# Patient Record
Sex: Female | Born: 1965 | Race: Black or African American | Hispanic: No | Marital: Single | Smoking: Never smoker
Health system: Southern US, Community
[De-identification: ages and names within clinical notes are randomized; demographics above are authoritative.]

## PROBLEM LIST (undated history)

## (undated) DIAGNOSIS — E669 Obesity, unspecified: Secondary | ICD-10-CM

## (undated) DIAGNOSIS — IMO0002 Reserved for concepts with insufficient information to code with codable children: Secondary | ICD-10-CM

## (undated) DIAGNOSIS — R87619 Unspecified abnormal cytological findings in specimens from cervix uteri: Secondary | ICD-10-CM

## (undated) HISTORY — DX: Obesity, unspecified: E66.9

## (undated) HISTORY — DX: Reserved for concepts with insufficient information to code with codable children: IMO0002

## (undated) HISTORY — PX: ABDOMINAL HYSTERECTOMY: SHX81

## (undated) HISTORY — PX: TUBAL LIGATION: SHX77

## (undated) HISTORY — DX: Unspecified abnormal cytological findings in specimens from cervix uteri: R87.619

---

## 2011-05-30 ENCOUNTER — Other Ambulatory Visit: Payer: Self-pay | Admitting: Obstetrics & Gynecology

## 2011-05-30 ENCOUNTER — Encounter (INDEPENDENT_AMBULATORY_CARE_PROVIDER_SITE_OTHER): Payer: BC Managed Care – PPO | Admitting: Obstetrics & Gynecology

## 2011-05-30 DIAGNOSIS — Z01419 Encounter for gynecological examination (general) (routine) without abnormal findings: Secondary | ICD-10-CM

## 2011-05-30 DIAGNOSIS — Z1272 Encounter for screening for malignant neoplasm of vagina: Secondary | ICD-10-CM

## 2011-05-30 DIAGNOSIS — Z113 Encounter for screening for infections with a predominantly sexual mode of transmission: Secondary | ICD-10-CM

## 2011-05-30 DIAGNOSIS — N852 Hypertrophy of uterus: Secondary | ICD-10-CM

## 2011-05-30 DIAGNOSIS — R1032 Left lower quadrant pain: Secondary | ICD-10-CM

## 2011-05-31 NOTE — Assessment & Plan Note (Signed)
NAMEMAAME, DACK               ACCOUNT NO.:  [id number]  MEDICAL RECORD NO.:  [id number]           PATIENT TYPE:  LOCATION:  [facility] [place]           FACILITY:  PHYSICIAN:  Allie Bossier, MD             DATE OF BIRTH:  DATE OF SERVICE:  05/30/2011                                 CLINIC NOTE  Ms. Harm is a 45 year old single African American G2, P1, A1 who is here for her annual exam.  She complains of a 54-month history of left lower quadrant pain that comes and goes, it has not been particularly painful, but it is different than her usual.  Her periods are monthly and not excessive concern to her.  PAST MEDICAL HISTORY:  She is overweight/obese.  REVIEW OF SYSTEMS:  She moved from [place] about 7 months ago to be closer to her daughter who is arising sophomore in [place]. She works at [employer] in [employer].  She is abstinent currently and the remainder review of systems questions are negative.  Her Pap smear and mammogram were both done in 2010 and were reportedly normal.  PREVIOUS SURGERIES:  She had a tubal ligation in 2005.  No latex allergies.  No drug allergies.  FAMILY HISTORY:  Negative for breast, GYN, and colon malignancy, but positive for diabetes.  SOCIAL HISTORY:  She reports social alcohol, but denies illegal drug use or tobacco.  PHYSICAL EXAMINATION:  GENERAL:  Well-nourished, well-hydrated pleasant African American female. VITAL SIGNS:  Height 5 feet 4 inches, weight 186 pounds, blood pressure 130/85, pulse 68. HEENT:  Normal heart regular rate rhythm. LUNGS:  Clear to auscultation bilaterally. ABDOMEN:  Benign. BREASTS:  Normal bilaterally. EXTERNAL GENITALIA:  No lesions.  Cervix nulliparous.  Uterus is 10-12 weeks' size and is deviated to her left.  Adnexa are nonenlarged, nontender.  ASSESSMENT AND PLAN: 1. Annual exam.  I have the checked Pap smear, scheduled a mammogram.     I recommend self-breast and self-vulvar exams. 2.  Left lower quadrant pain and an enlarged uterus deviating to the     left.  I am ordering a GYN ultrasound for further evaluation of     this issue.  I am checking cervical cultures. 3. Routine health maintenance.  I am checking fasting lipids, sugar,     and TSH.  I will see her back when her ultrasound results are     available.     Allie Bossier, MD    MCD/MEDQ  D:  05/30/2011  T:  05/30/2011  Job:  [id number]

## 2011-06-01 ENCOUNTER — Ambulatory Visit
Admission: RE | Admit: 2011-06-01 | Discharge: 2011-06-01 | Disposition: A | Discharge status: Home / Self Care | Payer: BC Managed Care – PPO | Source: Ambulatory Visit | Attending: Obstetrics & Gynecology | Admitting: Obstetrics & Gynecology

## 2011-06-01 ENCOUNTER — Other Ambulatory Visit: Payer: BC Managed Care – PPO

## 2011-06-01 DIAGNOSIS — N852 Hypertrophy of uterus: Secondary | ICD-10-CM

## 2011-06-13 ENCOUNTER — Ambulatory Visit (INDEPENDENT_AMBULATORY_CARE_PROVIDER_SITE_OTHER): Payer: BC Managed Care – PPO | Admitting: Obstetrics & Gynecology

## 2011-06-13 VITALS — BP 123/74 | HR 72 | Resp 16 | Ht 65.0 in | Wt 190.0 lb

## 2011-06-13 DIAGNOSIS — D251 Intramural leiomyoma of uterus: Secondary | ICD-10-CM

## 2011-06-13 DIAGNOSIS — E669 Obesity, unspecified: Secondary | ICD-10-CM

## 2011-06-13 NOTE — Progress Notes (Signed)
  45 yo AA g2p1a1here for follow up for evaluation of her pelvic pain/LLQ pain.  Ultlrasound shows 2 large fibroids (4cm and 6cm) as well as a possible polyp.  She is slightly anemic with a HBG of 11.3.  I have offered her an outpatient robotic hysterectomy to be scheduled at her convenience.

## 2012-01-01 ENCOUNTER — Encounter: Payer: Self-pay | Admitting: Obstetrics & Gynecology

## 2012-01-01 ENCOUNTER — Ambulatory Visit (INDEPENDENT_AMBULATORY_CARE_PROVIDER_SITE_OTHER): Payer: BC Managed Care – PPO | Admitting: Obstetrics & Gynecology

## 2012-01-01 VITALS — BP 134/84 | HR 79 | Temp 96.8°F | Resp 16 | Ht 65.0 in | Wt 196.0 lb

## 2012-01-01 DIAGNOSIS — N938 Other specified abnormal uterine and vaginal bleeding: Secondary | ICD-10-CM

## 2012-01-01 DIAGNOSIS — D649 Anemia, unspecified: Secondary | ICD-10-CM

## 2012-01-01 DIAGNOSIS — N949 Unspecified condition associated with female genital organs and menstrual cycle: Secondary | ICD-10-CM

## 2012-01-01 DIAGNOSIS — D259 Leiomyoma of uterus, unspecified: Secondary | ICD-10-CM

## 2012-01-01 DIAGNOSIS — D219 Benign neoplasm of connective and other soft tissue, unspecified: Secondary | ICD-10-CM

## 2012-01-01 NOTE — Progress Notes (Signed)
  Subjective:    Patient ID: Mary Blankenship, female    DOB: July 01, 1966, 46 y.o.   MRN: [id number]  HPI  Ms. Lucus has been doing some considerable thought and research into possible surgery since her last visit here. After a long discussion, she does want to schedule a RATH. We have discussed risks of surgery.  Review of Systems     Objective:   Physical Exam        Assessment & Plan:  Symptomatic fibroids- Schedule RATH.

## 2012-01-11 ENCOUNTER — Encounter: Payer: Self-pay | Admitting: Family

## 2012-01-11 ENCOUNTER — Ambulatory Visit (INDEPENDENT_AMBULATORY_CARE_PROVIDER_SITE_OTHER): Payer: BC Managed Care – PPO | Admitting: Family

## 2012-01-11 VITALS — BP 125/80 | HR 69 | Temp 97.9°F | Resp 16 | Ht 65.0 in | Wt 196.0 lb

## 2012-01-11 DIAGNOSIS — N23 Unspecified renal colic: Secondary | ICD-10-CM

## 2012-01-11 DIAGNOSIS — R300 Dysuria: Secondary | ICD-10-CM

## 2012-01-11 DIAGNOSIS — R309 Painful micturition, unspecified: Secondary | ICD-10-CM

## 2012-01-11 LAB — POCT URINALYSIS DIPSTICK
Ketones, UA: NEGATIVE
Spec Grav, UA: 1.025
pH, UA: 5

## 2012-01-11 MED ORDER — SULFAMETHOXAZOLE-TRIMETHOPRIM 800-160 MG PO TABS: 1.0000 | ORAL_TABLET | Freq: Two times a day (BID) | ORAL | Status: AC

## 2012-01-11 NOTE — Patient Instructions (Signed)
Place urinary tract infection patient instructions here.

## 2012-01-11 NOTE — Progress Notes (Signed)
  Subjective:    Mary Blankenship is a 46 y.o. female who complains of burning with urination. She has had symptoms for 2 days. Patient also complains of none. Patient denies back pain, fever and vaginal discharge. Patient does not have a history of recurrent UTI. Patient does not have a history of pyelonephritis.   The following portions of the patient's history were reviewed and updated as appropriate: allergies, current medications, past family history, past medical history, past social history, past surgical history and problem list.  Review of Systems Pertinent items are noted in HPI.    Objective:    No exam performed today, symptoms did not indicate.  Laboratory:  Urine dipstick: 1+ for hemoglobin.   Micro exam: negative for WBCs or RBCs.    Assessment:    Dysuria  Plan:    Medications: TMP/SMX.  Urine culture sent to lab.

## 2012-01-31 ENCOUNTER — Encounter (HOSPITAL_COMMUNITY): Payer: Self-pay | Admitting: Pharmacist

## 2012-02-12 ENCOUNTER — Encounter (HOSPITAL_COMMUNITY)
Admission: RE | Admit: 2012-02-12 | Discharge: 2012-02-12 | Disposition: A | Discharge status: Home / Self Care | Payer: BC Managed Care – PPO | Source: Ambulatory Visit | Attending: Obstetrics & Gynecology | Admitting: Obstetrics & Gynecology

## 2012-02-12 ENCOUNTER — Encounter (HOSPITAL_COMMUNITY): Payer: Self-pay

## 2012-02-12 ENCOUNTER — Inpatient Hospital Stay (HOSPITAL_COMMUNITY): Admission: RE | Admit: 2012-02-12 | Payer: BC Managed Care – PPO | Source: Ambulatory Visit

## 2012-02-12 LAB — CBC
HCT: 34.9 % — ABNORMAL LOW (ref 36.0–46.0)
Hemoglobin: 11.4 g/dL — ABNORMAL LOW (ref 12.0–15.0)
MCH: 28.4 pg (ref 26.0–34.0)
MCHC: 32.7 g/dL (ref 30.0–36.0)

## 2012-02-12 LAB — SURGICAL PCR SCREEN: Staphylococcus aureus: NEGATIVE

## 2012-02-12 NOTE — Patient Instructions (Addendum)
20 Mary Blankenship  02/12/2012   Your procedure is scheduled on:  02/19/12  Enter through the Main Entrance of [facility] at 6 AM.  Pick up the phone at the desk and dial [phone].   Call this number if you have problems the morning of surgery: [phone]   Remember:   Do not eat food:After Midnight.  Do not drink clear liquids: After Midnight.  Take these medicines the morning of surgery with A SIP OF WATER: NA   Do not wear jewelry, make-up or nail polish.  Do not wear lotions, powders, or perfumes. You may wear deodorant.  Do not shave 48 hours prior to surgery.  Do not bring valuables to the hospital.  Contacts, dentures or bridgework may not be worn into surgery.  Leave suitcase in the car. After surgery it may be brought to your room.  For patients admitted to the hospital, checkout time is 11:00 AM the day of discharge.   Patients discharged the day of surgery will not be allowed to drive home.  Name and phone number of your driver: NA  Special Instructions: CHG Shower Use Special Wash: 1/2 bottle night before surgery and 1/2 bottle morning of surgery.   Please read over the following fact sheets that you were given: MRSA Information

## 2012-02-18 MED ORDER — CEFAZOLIN SODIUM 1-5 GM-% IV SOLN: 1.0000 g | INTRAVENOUS | Status: DC

## 2012-02-18 MED ORDER — CEFAZOLIN SODIUM-DEXTROSE 2-3 GM-% IV SOLR
2.0000 g | INTRAVENOUS | Status: AC
  Administered 2012-02-19: 2 g via INTRAVENOUS
  Filled 2012-02-18: qty 50

## 2012-02-19 ENCOUNTER — Encounter (HOSPITAL_COMMUNITY): Payer: Self-pay | Admitting: Anesthesiology

## 2012-02-19 ENCOUNTER — Ambulatory Visit (HOSPITAL_COMMUNITY)
Admission: RE | Admit: 2012-02-19 | Discharge: 2012-02-20 | Disposition: A | Discharge status: Home / Self Care | Payer: BC Managed Care – PPO | Source: Ambulatory Visit | Attending: Obstetrics & Gynecology | Admitting: Obstetrics & Gynecology

## 2012-02-19 ENCOUNTER — Encounter (HOSPITAL_COMMUNITY): Payer: Self-pay | Admitting: *Deleted

## 2012-02-19 ENCOUNTER — Ambulatory Visit (HOSPITAL_COMMUNITY): Payer: BC Managed Care – PPO | Admitting: Anesthesiology

## 2012-02-19 ENCOUNTER — Encounter (HOSPITAL_COMMUNITY)
Admission: RE | Disposition: A | Discharge status: Home / Self Care | Payer: Self-pay | Source: Ambulatory Visit | Attending: Obstetrics & Gynecology

## 2012-02-19 DIAGNOSIS — N920 Excessive and frequent menstruation with regular cycle: Secondary | ICD-10-CM | POA: Insufficient documentation

## 2012-02-19 DIAGNOSIS — D251 Intramural leiomyoma of uterus: Secondary | ICD-10-CM | POA: Insufficient documentation

## 2012-02-19 DIAGNOSIS — Z01812 Encounter for preprocedural laboratory examination: Secondary | ICD-10-CM

## 2012-02-19 DIAGNOSIS — Z01818 Encounter for other preprocedural examination: Secondary | ICD-10-CM | POA: Insufficient documentation

## 2012-02-19 DIAGNOSIS — D649 Anemia, unspecified: Secondary | ICD-10-CM | POA: Insufficient documentation

## 2012-02-19 HISTORY — PX: CYSTOSCOPY: SHX5120

## 2012-02-19 SURGERY — ROBOTIC ASSISTED TOTAL HYSTERECTOMY
Anesthesia: General | Site: Urethra | Wound class: Clean Contaminated

## 2012-02-19 MED ORDER — DEXAMETHASONE SODIUM PHOSPHATE 4 MG/ML IJ SOLN
INTRAMUSCULAR | Status: DC | PRN
  Administered 2012-02-19: 10 mg via INTRAVENOUS

## 2012-02-19 MED ORDER — IBUPROFEN 800 MG PO TABS
800.0000 mg | ORAL_TABLET | Freq: Three times a day (TID) | ORAL | Status: DC | PRN
  Administered 2012-02-20: 800 mg via ORAL
  Filled 2012-02-19: qty 1

## 2012-02-19 MED ORDER — OXYCODONE-ACETAMINOPHEN 5-325 MG PO TABS
1.0000 | ORAL_TABLET | ORAL | Status: DC | PRN
  Administered 2012-02-19: 1 via ORAL
  Filled 2012-02-19: qty 1

## 2012-02-19 MED ORDER — NEOSTIGMINE METHYLSULFATE 1 MG/ML IJ SOLN
INTRAMUSCULAR | Status: AC
  Filled 2012-02-19: qty 10

## 2012-02-19 MED ORDER — KETOROLAC TROMETHAMINE 30 MG/ML IJ SOLN
15.0000 mg | Freq: Once | INTRAMUSCULAR | Status: AC | PRN
  Administered 2012-02-19: 30 mg via INTRAVENOUS

## 2012-02-19 MED ORDER — DEXAMETHASONE SODIUM PHOSPHATE 10 MG/ML IJ SOLN
INTRAMUSCULAR | Status: AC
  Filled 2012-02-19: qty 1

## 2012-02-19 MED ORDER — IBUPROFEN 800 MG PO TABS: 800.0000 mg | ORAL_TABLET | Freq: Three times a day (TID) | ORAL | Status: AC | PRN

## 2012-02-19 MED ORDER — PROMETHAZINE HCL 25 MG/ML IJ SOLN
12.5000 mg | INTRAMUSCULAR | Status: DC | PRN
  Administered 2012-02-19: 12.5 mg via INTRAVENOUS
  Filled 2012-02-19: qty 1

## 2012-02-19 MED ORDER — LACTATED RINGERS IR SOLN
Status: DC | PRN
  Administered 2012-02-19: 3000 mL

## 2012-02-19 MED ORDER — DEXAMETHASONE SODIUM PHOSPHATE 4 MG/ML IJ SOLN: 8.0000 mg | Freq: Once | INTRAMUSCULAR | Status: DC | PRN

## 2012-02-19 MED ORDER — ONDANSETRON HCL 4 MG/2ML IJ SOLN
INTRAMUSCULAR | Status: AC
  Filled 2012-02-19: qty 2

## 2012-02-19 MED ORDER — GLYCOPYRROLATE 0.2 MG/ML IJ SOLN
INTRAMUSCULAR | Status: AC
  Filled 2012-02-19: qty 1

## 2012-02-19 MED ORDER — HYDROMORPHONE HCL PF 1 MG/ML IJ SOLN
INTRAMUSCULAR | Status: AC
  Administered 2012-02-19: 0.5 mg via INTRAVENOUS
  Filled 2012-02-19: qty 1

## 2012-02-19 MED ORDER — ACETAMINOPHEN 10 MG/ML IV SOLN
1000.0000 mg | Freq: Four times a day (QID) | INTRAVENOUS | Status: DC
  Administered 2012-02-19: 1000 mg via INTRAVENOUS
  Filled 2012-02-19 (×4): qty 100

## 2012-02-19 MED ORDER — MIDAZOLAM HCL 5 MG/5ML IJ SOLN
INTRAMUSCULAR | Status: DC | PRN
  Administered 2012-02-19: 2 mg via INTRAVENOUS

## 2012-02-19 MED ORDER — PROPOFOL 10 MG/ML IV EMUL
INTRAVENOUS | Status: AC
  Filled 2012-02-19: qty 20

## 2012-02-19 MED ORDER — OXYCODONE-ACETAMINOPHEN 5-325 MG PO TABS: 1.0000 | ORAL_TABLET | ORAL | Status: AC | PRN

## 2012-02-19 MED ORDER — INDIGOTINDISULFONATE SODIUM 8 MG/ML IJ SOLN
INTRAMUSCULAR | Status: AC
  Filled 2012-02-19: qty 5

## 2012-02-19 MED ORDER — FENTANYL CITRATE 0.05 MG/ML IJ SOLN
INTRAMUSCULAR | Status: DC | PRN
  Administered 2012-02-19: 100 ug via INTRAVENOUS
  Administered 2012-02-19: 50 ug via INTRAVENOUS
  Administered 2012-02-19: 100 ug via INTRAVENOUS

## 2012-02-19 MED ORDER — FENTANYL CITRATE 0.05 MG/ML IJ SOLN: 25.0000 ug | INTRAMUSCULAR | Status: DC | PRN

## 2012-02-19 MED ORDER — LIDOCAINE HCL (CARDIAC) 20 MG/ML IV SOLN
INTRAVENOUS | Status: AC
  Filled 2012-02-19: qty 5

## 2012-02-19 MED ORDER — FENTANYL CITRATE 0.05 MG/ML IJ SOLN
INTRAMUSCULAR | Status: AC
  Filled 2012-02-19: qty 5

## 2012-02-19 MED ORDER — KETOROLAC TROMETHAMINE 30 MG/ML IJ SOLN: 30.0000 mg | Freq: Once | INTRAMUSCULAR | Status: DC

## 2012-02-19 MED ORDER — NEOSTIGMINE METHYLSULFATE 1 MG/ML IJ SOLN
INTRAMUSCULAR | Status: DC | PRN
  Administered 2012-02-19: 3 mg via INTRAVENOUS

## 2012-02-19 MED ORDER — ROPIVACAINE HCL 5 MG/ML IJ SOLN
INTRAMUSCULAR | Status: DC | PRN
  Administered 2012-02-19: 60 mL via EPIDURAL

## 2012-02-19 MED ORDER — ROPIVACAINE HCL 5 MG/ML IJ SOLN
INTRAMUSCULAR | Status: AC
  Filled 2012-02-19: qty 60

## 2012-02-19 MED ORDER — DEXTROSE IN LACTATED RINGERS 5 % IV SOLN
INTRAVENOUS | Status: DC
  Administered 2012-02-19 – 2012-02-20 (×2): via INTRAVENOUS

## 2012-02-19 MED ORDER — ROCURONIUM BROMIDE 50 MG/5ML IV SOLN
INTRAVENOUS | Status: AC
  Filled 2012-02-19: qty 2

## 2012-02-19 MED ORDER — ROCURONIUM BROMIDE 100 MG/10ML IV SOLN
INTRAVENOUS | Status: DC | PRN
  Administered 2012-02-19: 20 mg via INTRAVENOUS
  Administered 2012-02-19: 50 mg via INTRAVENOUS

## 2012-02-19 MED ORDER — HYDROMORPHONE HCL PF 1 MG/ML IJ SOLN
INTRAMUSCULAR | Status: AC
  Filled 2012-02-19: qty 1

## 2012-02-19 MED ORDER — HYDROMORPHONE HCL PF 1 MG/ML IJ SOLN
0.2500 mg | INTRAMUSCULAR | Status: DC | PRN
  Administered 2012-02-19 (×4): 0.5 mg via INTRAVENOUS

## 2012-02-19 MED ORDER — DEXTROSE IN LACTATED RINGERS 5 % IV SOLN: INTRAVENOUS | Status: DC

## 2012-02-19 MED ORDER — LIDOCAINE HCL (CARDIAC) 20 MG/ML IV SOLN
INTRAVENOUS | Status: DC | PRN
  Administered 2012-02-19: 100 mg via INTRAVENOUS

## 2012-02-19 MED ORDER — INDIGOTINDISULFONATE SODIUM 8 MG/ML IJ SOLN
INTRAMUSCULAR | Status: DC | PRN
  Administered 2012-02-19: 40 mg via INTRAVENOUS

## 2012-02-19 MED ORDER — KETOROLAC TROMETHAMINE 30 MG/ML IJ SOLN
INTRAMUSCULAR | Status: AC
  Administered 2012-02-19: 30 mg via INTRAVENOUS
  Filled 2012-02-19: qty 1

## 2012-02-19 MED ORDER — MIDAZOLAM HCL 2 MG/2ML IJ SOLN
INTRAMUSCULAR | Status: AC
  Filled 2012-02-19: qty 2

## 2012-02-19 MED ORDER — LACTATED RINGERS IV SOLN
INTRAVENOUS | Status: DC | PRN
  Administered 2012-02-19 (×2): via INTRAVENOUS

## 2012-02-19 MED ORDER — HYDROMORPHONE HCL PF 1 MG/ML IJ SOLN
INTRAMUSCULAR | Status: DC | PRN
  Administered 2012-02-19: 1 mg via INTRAVENOUS

## 2012-02-19 MED ORDER — GLYCOPYRROLATE 0.2 MG/ML IJ SOLN
INTRAMUSCULAR | Status: DC | PRN
  Administered 2012-02-19: 0.4 mg via INTRAVENOUS

## 2012-02-19 MED ORDER — MEPERIDINE HCL 25 MG/ML IJ SOLN: 6.2500 mg | INTRAMUSCULAR | Status: DC | PRN

## 2012-02-19 MED ORDER — ONDANSETRON HCL 4 MG/2ML IJ SOLN
INTRAMUSCULAR | Status: DC | PRN
  Administered 2012-02-19: 4 mg via INTRAVENOUS

## 2012-02-19 MED ORDER — PROPOFOL 10 MG/ML IV EMUL
INTRAVENOUS | Status: DC | PRN
  Administered 2012-02-19: 200 mg via INTRAVENOUS

## 2012-02-19 MED ORDER — SODIUM CHLORIDE 0.9 % IJ SOLN
INTRAMUSCULAR | Status: DC | PRN
  Administered 2012-02-19: 60 mL via INTRAVENOUS

## 2012-02-19 SURGICAL SUPPLY — 66 items
BAG URINE DRAINAGE (UROLOGICAL SUPPLIES) ×3 IMPLANT
BARRIER ADHS 3X4 INTERCEED (GAUZE/BANDAGES/DRESSINGS) IMPLANT
BENZOIN TINCTURE PRP APPL 2/3 (GAUZE/BANDAGES/DRESSINGS) IMPLANT
BLADE LAPAROSCOPIC MORCELL KIT (BLADE) IMPLANT
CABLE HIGH FREQUENCY MONO STRZ (ELECTRODE) ×3 IMPLANT
CATH FOLEY 3WAY  5CC 16FR (CATHETERS) ×1
CATH FOLEY 3WAY 5CC 16FR (CATHETERS) ×2 IMPLANT
CLOTH BEACON ORANGE TIMEOUT ST (SAFETY) ×3 IMPLANT
CONT PATH 16OZ SNAP LID 3702 (MISCELLANEOUS) ×3 IMPLANT
COVER MAYO STAND STRL (DRAPES) ×3 IMPLANT
COVER TABLE BACK 60X90 (DRAPES) ×6 IMPLANT
COVER TIP SHEARS 8 DVNC (MISCELLANEOUS) ×2 IMPLANT
COVER TIP SHEARS 8MM DA VINCI (MISCELLANEOUS) ×1
DECANTER SPIKE VIAL GLASS SM (MISCELLANEOUS) ×3 IMPLANT
DERMABOND ADVANCED (GAUZE/BANDAGES/DRESSINGS) ×1
DERMABOND ADVANCED .7 DNX12 (GAUZE/BANDAGES/DRESSINGS) ×2 IMPLANT
DRAPE HUG U DISPOSABLE (DRAPE) ×3 IMPLANT
DRAPE LG THREE QUARTER DISP (DRAPES) ×6 IMPLANT
DRAPE MONITOR DA VINCI (DRAPE) IMPLANT
DRAPE WARM FLUID 44X44 (DRAPE) ×3 IMPLANT
ELECT REM PT RETURN 9FT ADLT (ELECTROSURGICAL) ×3
ELECTRODE REM PT RTRN 9FT ADLT (ELECTROSURGICAL) ×2 IMPLANT
EVACUATOR SMOKE 8.L (FILTER) ×3 IMPLANT
GAUZE VASELINE 3X9 (GAUZE/BANDAGES/DRESSINGS) ×3 IMPLANT
GLOVE BIO SURGEON STRL SZ 6.5 (GLOVE) ×9 IMPLANT
GLOVE ECLIPSE 6.5 STRL STRAW (GLOVE) ×9 IMPLANT
GOWN STRL REIN XL XLG (GOWN DISPOSABLE) ×18 IMPLANT
KIT ACCESSORY DA VINCI DISP (KITS) ×1
KIT ACCESSORY DVNC DISP (KITS) ×2 IMPLANT
KIT DISP ACCESSORY 4 ARM (KITS) IMPLANT
MANIPULATOR UTERINE 4.5 ZUMI (MISCELLANEOUS) IMPLANT
NEEDLE INSUFFLATION 14GA 120MM (NEEDLE) ×3 IMPLANT
NEEDLE SPNL 18GX3.5 QUINCKE PK (NEEDLE) IMPLANT
OCCLUDER COLPOPNEUMO (BALLOONS) ×3 IMPLANT
PACK LAVH (CUSTOM PROCEDURE TRAY) ×3 IMPLANT
PAD PREP 24X48 CUFFED NSTRL (MISCELLANEOUS) ×6 IMPLANT
PLUG CATH AND CAP STER (CATHETERS) ×3 IMPLANT
POSITIONER SURGICAL ARM (MISCELLANEOUS) IMPLANT
PROTECTOR NERVE ULNAR (MISCELLANEOUS) ×6 IMPLANT
SET CYSTO W/LG BORE CLAMP LF (SET/KITS/TRAYS/PACK) IMPLANT
SET IRRIG TUBING LAPAROSCOPIC (IRRIGATION / IRRIGATOR) ×3 IMPLANT
SOLUTION ELECTROLUBE (MISCELLANEOUS) ×3 IMPLANT
SPONGE LAP 18X18 X RAY DECT (DISPOSABLE) IMPLANT
STRIP CLOSURE SKIN 1/2X4 (GAUZE/BANDAGES/DRESSINGS) IMPLANT
SUT VIC AB 0 CT1 27 (SUTURE) ×2
SUT VIC AB 0 CT1 27XBRD ANBCTR (SUTURE) ×4 IMPLANT
SUT VIC AB 0 CT1 27XBRD ANTBC (SUTURE) IMPLANT
SUT VIC AB 2-0 CT2 27 (SUTURE) IMPLANT
SUT VICRYL 0 UR6 27IN ABS (SUTURE) ×9 IMPLANT
SUT VICRYL RAPIDE 4/0 PS 2 (SUTURE) ×6 IMPLANT
SUT VLOC 180 0 9IN  GS21 (SUTURE)
SUT VLOC 180 0 9IN GS21 (SUTURE) IMPLANT
SYR 50ML LL SCALE MARK (SYRINGE) ×3 IMPLANT
SYSTEM CONVERTIBLE TROCAR (TROCAR) IMPLANT
TIP UTERINE 5.1X6CM LAV DISP (MISCELLANEOUS) IMPLANT
TIP UTERINE 6.7X10CM GRN DISP (MISCELLANEOUS) IMPLANT
TIP UTERINE 6.7X6CM WHT DISP (MISCELLANEOUS) IMPLANT
TIP UTERINE 6.7X8CM BLUE DISP (MISCELLANEOUS) ×3 IMPLANT
TOWEL OR 17X24 6PK STRL BLUE (TOWEL DISPOSABLE) ×6 IMPLANT
TROCAR DISP BLADELESS 8 DVNC (TROCAR) ×2 IMPLANT
TROCAR DISP BLADELESS 8MM (TROCAR) ×1
TROCAR XCEL 12X100 BLDLESS (ENDOMECHANICALS) ×3 IMPLANT
TROCAR Z-THREAD 12X150 (TROCAR) ×3 IMPLANT
TROCAR Z-THREAD BLADED 12X100M (TROCAR) IMPLANT
TUBING FILTER THERMOFLATOR (ELECTROSURGICAL) ×3 IMPLANT
WATER STERILE IRR 1000ML POUR (IV SOLUTION) ×9 IMPLANT

## 2012-02-19 NOTE — Op Note (Signed)
02/19/2012  10:05 AM  PATIENT:  Mary Blankenship  46 y.o. female  PRE-OPERATIVE DIAGNOSIS:  fibroids;dub;anemia  POST-OPERATIVE DIAGNOSIS:  fibroids, anemia,  dysfunctional uterine bleeding  PROCEDURE:  Procedure(s) (LRB): ROBOTIC ASSISTED TOTAL HYSTERECTOMY (N/A) CYSTOSCOPY (N/A) BILATERAL SALPINGECTOMY  SURGEON:  Surgeon(s) and Role:    * Allie Bossier, MD - Primary      PHYSICIAN ASSISTANT:   ASSISTANTS: Elsie Lincoln, MD  ANESTHESIA:   general  EBL:  Total I/O In: 1000 [I.V.:1000] Out: 350 [Urine:200; Blood:150]  BLOOD ADMINISTERED:none  DRAINS: none   LOCAL MEDICATIONS USED:  OTHER Ropivicaine 60 cc  SPECIMEN:  Source of Specimen:  uterus, tubes  DISPOSITION OF SPECIMEN:  PATHOLOGY  COUNTS:  YES  TOURNIQUET:  * No tourniquets in log *  DICTATION: .Dragon Dictation  PLAN OF CARE: outpatient with extended recovery  PATIENT DISPOSITION:  PACU - hemodynamically stable.   Delay start of Pharmacological VTE agent (>24hrs) due to surgical blood loss or risk of bleeding: not applicable  The risks, benefits, and alternatives of surgery were explained, understood, accepted. All questions were answered. Consents were signed. Taken to the operating room and placed in the dorsal lithotomy position. Once she was comfortable, general anesthesia was applied without complication. Her head and arms were carefully tucked and . Her abdomen and vagina were prepped and draped in the usual sterile fashion. A bimanual exam revealed a 12 week size globular, mobile uterus and nonenlarged adnexa. A paravaginal block was done with ropivacaine (10cc). Her uterus sounded to 11 cm. Her cervix was then dilated to accommodate a Rumi uterine manipulator. A Foley catheter was placed also. It drained clear urine throughout case. Gloves were changed attention was turned to the abdomen. A vertical umbilical incision was made in a varies needle was placed intraperitoneally. Low-flow CO2 was used to  insufflate the abdomen. Patient abdominal pressure was always less than 15. Once I examined the uterus from the laparoscope point of view, I decided that the camera would be more useful more cephalad.I therefore made a transverse incision approximately 4 inches superior to the umbilicus and placed a XL trocar with the laparoscope. Please note that prior to making any abdominal incision site injected with ropivacaine. I placed 120 cc of dilute ropivacaine in to her pelvis at this point I then placed an 8 mm robotic ports approximately 10 cm to the right of the umbilicus and 1 approximately 10 cm to the left of the umbilicus. A 12 mm system port was placed in the right lower cautery. All these placements were done under laparoscopic visualization. The robot was docked and instruments were placed. I then proceeded to work at the robotic console. The pelvis was inspected her gallbladder appeared slightly enlarged the liver appeared normal both ovaries appeared normal as did the oviducts. They did show evidence of previous tubal ligation the uterus was grossly enlarged with multiple (at least 3) 4-6 cm fibroids. I began by cauterizing the uterine ovarian ligaments on each side, followed by the cautery of the round ligaments on each side, followed by creation of a bladder flap anteriorly. I then cauterized the uterine vessels on each side. I made an anterior colpotomy and proceeded to carried out incision around circumferentially, taking care to avoid the bowel. The uterus was pulled through the vagina and carefully. I then used the cautery to remove both oviducts. The vaginal cuff was closed with 5-0 Vicryl interrupted figure of 8 sutures. Excellent hemostasis was noted. The patient was given indigo carmine  intravenously. I noted the functioning and position of the ureters throughout the case. I then undocked to the robot and we used an laparoscopic closure device to close the fascia at the 12 mm incision site. The  camera port fascia was elevated and closed with figure-of-eight 0 Vicryl suture. The subcuticular closure 12 for Vicryl. Excellent cosmetic results were obtained. I then proceeded with cystoscopy that showed a normal bladder and and blue dye coming from each ureter. She was taken to recovery room in stable condition. She tolerated the procedure well.

## 2012-02-19 NOTE — Discharge Instructions (Signed)
Hysterectomy Information  A hysterectomy is a procedure where your uterus is surgically removed. It will no longer be possible to have menstrual periods or to become pregnant. The tubes and ovaries can be removed (bilateral salpingo-oopherectomy) during this surgery as well.  REASONS FOR A HYSTERECTOMY  Persistent, abnormal bleeding.   Lasting (chronic) pelvic pain or infection.   The lining of the uterus (endometrium) starts growing outside the uterus (endometriosis).   The endometrium starts growing in the muscle of the uterus (adenomyosis).   The uterus falls down into the vagina (pelvic organ prolapse).   Symptomatic uterine fibroids.   Precancerous cells.   Cervical cancer or uterine cancer.  TYPES OF HYSTERECTOMIES  Supracervical hysterectomy. This type removes the top part of the uterus, but not the cervix.   Total hysterectomy. This type removes the uterus and cervix.   Radical hysterectomy. This type removes the uterus, cervix, and the fibrous tissue that holds the uterus in place in the pelvis (parametrium).  WAYS A HYSTERECTOMY CAN BE PERFORMED  Abdominal hysterectomy. A large surgical cut (incision) is made in the abdomen. The uterus is removed through this incision.   Vaginal hysterectomy. An incision is made in the vagina. The uterus is removed through this incision. There are no abdominal incisions.   Conventional laparoscopic hysterectomy. A thin, lighted tube with a camera (laparoscope) is inserted into 3 or 4 small incisions in the abdomen. The uterus is cut into small pieces. The small pieces are removed through the incisions, or they are removed through the vagina.   Laparoscopic assisted vaginal hysterectomy (LAVH). Three or four small incisions are made in the abdomen. Part of the surgery is performed laparoscopically and part vaginally. The uterus is removed through the vagina.   Robot-assisted laparoscopic hysterectomy. A laparoscope is inserted into 3 or 4  small incisions in the abdomen. A computer-controlled device is used to give the surgeon a 3D image. This allows for more precise movements of surgical instruments. The uterus is cut into small pieces and removed through the incisions or removed through the vagina.  RISKS OF HYSTERECTOMY   Bleeding and risk of blood transfusion. Tell your caregiver if you do not want to receive any blood products.   Blood clots in the legs or lung.   Infection.   Injury to surrounding organs.   Anesthesia problems or side effects.   Conversion to an abdominal hysterectomy.  WHAT TO EXPECT AFTER A HYSTERECTOMY  You will be given pain medicine.   You will need to have someone with you for the first 3 to 5 days after you go home.   You will need to follow up with your surgeon in 2 to 4 weeks after surgery to evaluate your progress.   You may have early menopause symptoms like hot flashes, night sweats, and insomnia.   If you had a hysterectomy for a problem that was not a cancer or a condition that could lead to cancer, then you no longer need Pap tests. However, even if you no longer need a Pap test, a regular exam is a good idea to make sure no other problems are starting.  Document Released: 05/15/2001 Document Revised: 11/08/2011 Document Reviewed: 06/30/2011 [facility] Patient Information 2012 [place], [place].    NOTHING IN VAGINA UNTIL AFTER POST OP VISIT AT 6 WEEKS.

## 2012-02-19 NOTE — Progress Notes (Signed)
S. She complains of nausea, no gas yet.   O. VSS, AF     Abd- decreased BS throughout, ND, normal post op tenderness     Voided 125 cc but has 382 cc left in bladder  A/P. Stable but I will keep her overnight until she is feeling better.

## 2012-02-19 NOTE — H&P (Signed)
Mary Blankenship is an 46 y.o. female.She has been experiencing heavy periods, lasting 7 days. Her Hbg is 11.4 and she has been taking iron until just recently. Her u/s showed 2 fibroids, including a 6 cm fibroid.  She has been abstinent for about a year, but denies dysparunia prior to that.  Pertinent Gynecological History: Menses: flow is excessive with use of 11 pads or tampons on heaviest days Bleeding: dysfunctional uterine bleeding Contraception: abstinence DES exposure: denies Blood transfusions: none Sexually transmitted diseases: no past history Previous GYN Procedures: [facility] at 46 yo  Last mammogram: normal Date: 2011 Last pap: normal Date: 6/12 OB History: G1, P1   Menstrual History: Menarche age: 3 No LMP recorded.    Past Medical History  Diagnosis Date  . Abnormal Pap smear     2010  . Obesity     Past Surgical History  Procedure Date  . Tubal ligation     2005    Family History  Problem Relation Age of Onset  . Diabetes Paternal Grandmother   . Cancer Paternal Grandmother   . Diabetes Maternal Grandmother     Social History:  reports that she has never smoked. She does not have any smokeless tobacco history on file. She reports that she drinks alcohol. Her drug history not on file.  Allergies: No Known Allergies  Prescriptions prior to admission  Medication Sig Dispense Refill  . b complex vitamins capsule Take 1 capsule by mouth daily.      . fish oil-omega-3 fatty acids 1000 MG capsule Take 1 g by mouth daily.       . Multiple Vitamin (MULTIVITAMIN) tablet Take 1 tablet by mouth daily.          ROS  Blood pressure 119/84, pulse 67, temperature 98.1 F (36.7 C), temperature source Oral, resp. rate 18, SpO2 99.00%. Physical Exam Heart- rrr Lungs- CTAB Abd- benign  Results for orders placed during the hospital encounter of 02/19/12 (from the past 24 hour(s))  PREGNANCY, URINE     Status: Normal   Collection Time   02/19/12  6:29 AM   Component Value Range   Preg Test, Ur NEGATIVE  NEGATIVE     No results found.  Assessment/Plan: Menorrhagia with fibroids- We have discussed her options. She elects to procede with a RATH/cystoscopy/removal of both tubes. She understands that there are risks of surgery including cuff dehiscence (1%), damage to bowel, bladder, ureters. She understands steep Trendelenburg position will be necessary during this case.   Drevin Ortner C. 02/19/2012, 7:02 AM

## 2012-02-19 NOTE — Anesthesia Postprocedure Evaluation (Signed)
Anesthesia Post Note  Patient: Mary Blankenship  Procedure(s) Performed: Procedure(s) (LRB): ROBOTIC ASSISTED TOTAL HYSTERECTOMY (N/A) CYSTOSCOPY (N/A)  Anesthesia type: General  Patient location: PACU  Post pain: Pain level controlled  Post assessment: Post-op Vital signs reviewed  Last Vitals:  Filed Vitals:   02/19/12 1045  BP: 116/71  Pulse: 57  Temp:   Resp: 20    Post vital signs: Reviewed  Level of consciousness: sedated  Complications: No apparent anesthesia complicationsfj

## 2012-02-19 NOTE — Transfer of Care (Signed)
Immediate Anesthesia Transfer of Care Note  Patient: Mary Blankenship  Procedure(s) Performed: Procedure(s) (LRB): ROBOTIC ASSISTED TOTAL HYSTERECTOMY (N/A) CYSTOSCOPY (N/A)  Patient Location: PACU  Anesthesia Type: General  Level of Consciousness: awake, alert  and oriented  Airway & Oxygen Therapy: Patient connected to nasal cannula oxygen  Post-op Assessment: Report given to PACU RN, Post -op Vital signs reviewed and stable and Patient moving all extremities  Post vital signs: stable  Complications: No apparent anesthesia complications

## 2012-02-19 NOTE — Anesthesia Preprocedure Evaluation (Signed)
Anesthesia Evaluation  Patient identified by MRN, date of birth, ID band Patient awake    Reviewed: Allergy & Precautions, H&P , NPO status , Patient's Chart, lab work & pertinent test results, reviewed documented beta blocker date and time   History of Anesthesia Complications Negative for: history of anesthetic complications  Airway Mallampati: I      Dental  (+) Teeth Intact   Pulmonary neg pulmonary ROS,  breath sounds clear to auscultation  Pulmonary exam normal       Cardiovascular Exercise Tolerance: Good negative cardio ROS  Rhythm:regular Rate:Normal     Neuro/Psych negative neurological ROS  negative psych ROS   GI/Hepatic negative GI ROS, Neg liver ROS,   Endo/Other  negative endocrine ROS  Renal/GU negative Renal ROS  negative genitourinary   Musculoskeletal   Abdominal   Peds  Hematology negative hematology ROS (+)   Anesthesia Other Findings   Reproductive/Obstetrics negative OB ROS                           Anesthesia Physical Anesthesia Plan  ASA: I  Anesthesia Plan: General ETT   Post-op Pain Management:    Induction:   Airway Management Planned:   Additional Equipment:   Intra-op Plan:   Post-operative Plan:   Informed Consent: I have reviewed the patients History and Physical, chart, labs and discussed the procedure including the risks, benefits and alternatives for the proposed anesthesia with the patient or authorized representative who has indicated his/her understanding and acceptance.   Dental Advisory Given  Plan Discussed with: CRNA and Surgeon  Anesthesia Plan Comments:         Anesthesia Quick Evaluation

## 2012-02-20 ENCOUNTER — Encounter (HOSPITAL_COMMUNITY): Payer: Self-pay | Admitting: Obstetrics & Gynecology

## 2012-02-20 NOTE — Addendum Note (Signed)
Addendum  created 02/20/12 1610 by Shanon Payor, CRNA   Modules edited:Notes Section

## 2012-02-20 NOTE — Progress Notes (Signed)
1 Day Post-Op Procedure(s) (LRB): ROBOTIC ASSISTED TOTAL HYSTERECTOMY (N/A) CYSTOSCOPY (N/A)  Subjective: Patient reports tolerating PO and no problems voiding.    Objective: I have reviewed patient's vital signs, intake and output and medications.  General: alert Resp: clear to auscultation bilaterally Cardio: regular rate and rhythm, S1, S2 normal, no murmur, click, rub or gallop GI: soft, non-tender; bowel sounds normal; no masses,  no organomegaly  Assessment: s/p Procedure(s) (LRB): ROBOTIC ASSISTED TOTAL HYSTERECTOMY (N/A) CYSTOSCOPY (N/A): stable  Plan: Discharge home  LOS: 1 day    Amica Harron C. 02/20/2012, 5:35 AM

## 2012-02-20 NOTE — Discharge Summary (Signed)
Physician Discharge Summary  Patient ID: Mary Blankenship MRN: [id number] DOB/AGE: 08-08-1966 46 y.o.  Admit date: 02/19/2012 Discharge date: 02/20/2012  Admission Diagnoses: fibroids and menorrhagia  Discharge Diagnoses: same Active Problems:  * No active hospital problems. *    Discharged Condition: good  Hospital Course: She underwent an uncomplicated RATH/BL salpingectomy/cystoscopy. By that evening she still was rating her pain score at an 8 so I kept her overnight. By POD#1 in the morning, she was tolerating po well, voiding without dysuria, and ambulating without difficulty. She verbalized her readiness to go home today.  Consults: None  Significant Diagnostic Studies: none  Treatments: surgery: as above  Discharge Exam: Blood pressure 118/69, pulse 64, temperature 99.3 F (37.4 C), temperature source Oral, resp. rate 16, height 5\' 4"  (1.626 m), weight 89.359 kg (197 lb), SpO2 99.00%. General appearance: alert Resp: clear to auscultation bilaterally Cardio: regular rate and rhythm, S1, S2 normal, no murmur, click, rub or gallop GI: soft, non-tender; bowel sounds normal; no masses,  no organomegaly Incisions: c/d/i, no erythema  Disposition: Final discharge disposition not confirmed   Medication List  As of 02/20/2012  5:37 AM   STOP taking these medications         b complex vitamins capsule      fish oil-omega-3 fatty acids 1000 MG capsule      multivitamin tablet         TAKE these medications         ibuprofen 800 MG tablet   Commonly known as: ADVIL,MOTRIN   Take 1 tablet (800 mg total) by mouth every 8 (eight) hours as needed for pain.      oxyCODONE-acetaminophen 5-325 MG per tablet   Commonly known as: PERCOCET   Take 1 tablet by mouth every 4 (four) hours as needed for pain.           Follow-up Information    Follow up with Daisja Kessinger C., MD. Schedule an appointment as soon as possible for a visit in 6 weeks.   Contact information:   [address] [place] [address] [phone]          Signed: Allie Bossier 02/20/2012, 5:37 AM

## 2012-02-20 NOTE — Progress Notes (Signed)
Pt ambulated out  Teaching complete 

## 2012-02-20 NOTE — Anesthesia Postprocedure Evaluation (Signed)
  Anesthesia Post-op Note  Patient: Mary Blankenship  Procedure(s) Performed: Procedure(s) (LRB): ROBOTIC ASSISTED TOTAL HYSTERECTOMY (N/A) CYSTOSCOPY (N/A)  Patient Location: [facility]  Anesthesia Type: General  Level of Consciousness: awake, alert  and oriented  Airway and Oxygen Therapy: Patient Spontanous Breathing  Post-op Pain: none  Post-op Assessment: Post-op Vital signs reviewed and Patient's Cardiovascular Status Stable  Post-op Vital Signs: Reviewed and stable  Complications: No apparent anesthesia complications

## 2012-02-20 NOTE — Progress Notes (Signed)
Dr Marice Potter notified pt and me that a blood spalsh had occurred yesterday during surgery. Pt agreeable to exposure panel being drawn. Voices understanding. Denies any risk factors for bloodborne illnesses.

## 2012-03-21 ENCOUNTER — Other Ambulatory Visit: Payer: Self-pay | Admitting: Plastic Surgery

## 2012-03-26 ENCOUNTER — Encounter: Payer: BC Managed Care – PPO | Admitting: Obstetrics & Gynecology

## 2012-04-15 ENCOUNTER — Encounter: Payer: Self-pay | Admitting: *Deleted

## 2012-04-15 ENCOUNTER — Encounter: Payer: Self-pay | Admitting: Obstetrics & Gynecology

## 2012-04-15 ENCOUNTER — Ambulatory Visit (INDEPENDENT_AMBULATORY_CARE_PROVIDER_SITE_OTHER): Payer: BC Managed Care – PPO | Admitting: Obstetrics & Gynecology

## 2012-04-15 VITALS — BP 107/67 | HR 67 | Temp 97.7°F | Resp 16 | Ht 66.0 in | Wt 204.0 lb

## 2012-04-15 DIAGNOSIS — Z09 Encounter for follow-up examination after completed treatment for conditions other than malignant neoplasm: Secondary | ICD-10-CM

## 2012-04-15 DIAGNOSIS — R635 Abnormal weight gain: Secondary | ICD-10-CM

## 2012-04-15 NOTE — Progress Notes (Signed)
  Subjective:    Patient ID: Mary Blankenship, female    DOB: 1966-05-01, 46 y.o.   MRN: [id number]  HPI  She is now 6 weeks status post RATH/salpinjectomy/cystoscopy with a 386 gram uterus removed. Pathol benign, fibroids. She wants to return to work and needs a note. She has not had sex yet and has no complaints. She does complain of weight gain.  Review of Systems     Objective:   Physical Exam Well-healed scars and vaginal cuff Bimanual exam normal       Assessment & Plan:  Post op doing well Weight gain- check TSH.

## 2014-06-24 ENCOUNTER — Encounter: Payer: Self-pay | Admitting: Obstetrics & Gynecology

## 2014-06-24 ENCOUNTER — Ambulatory Visit (INDEPENDENT_AMBULATORY_CARE_PROVIDER_SITE_OTHER): Payer: BC Managed Care – PPO | Admitting: Obstetrics & Gynecology

## 2014-06-24 VITALS — BP 137/90 | HR 66 | Resp 16 | Ht 65.0 in | Wt 204.0 lb

## 2014-06-24 DIAGNOSIS — Z0000 Encounter for general adult medical examination without abnormal findings: Secondary | ICD-10-CM

## 2014-06-24 DIAGNOSIS — N898 Other specified noninflammatory disorders of vagina: Secondary | ICD-10-CM

## 2014-06-24 NOTE — Progress Notes (Signed)
   Subjective:    Patient ID: Mary Blankenship, female    DOB: Sep 08, 1966, 48 y.o.   MRN: [id number]  HPI  This lovely lady is here today because of a vaginal discharge that was copious and white until she used Monistat 7. She reports that her symptoms have resolved. She has no other complaints.  Review of Systems     Objective:   Physical Exam  Breast exam- s/p reduction surgery. No masses. No lymph nodes EG-normal Vagina with thin white discharge, no odor      Assessment & Plan:  Possible BV- I will send a wet prep Preventative care- schedule a mammogram

## 2014-06-25 ENCOUNTER — Telehealth: Payer: Self-pay | Admitting: *Deleted

## 2014-06-25 ENCOUNTER — Other Ambulatory Visit: Payer: Self-pay | Admitting: Obstetrics & Gynecology

## 2014-06-25 ENCOUNTER — Ambulatory Visit (HOSPITAL_COMMUNITY)
Admission: RE | Admit: 2014-06-25 | Discharge: 2014-06-25 | Disposition: A | Discharge status: Home / Self Care | Payer: BC Managed Care – PPO | Source: Ambulatory Visit | Attending: Obstetrics & Gynecology | Admitting: Obstetrics & Gynecology

## 2014-06-25 DIAGNOSIS — B9689 Other specified bacterial agents as the cause of diseases classified elsewhere: Secondary | ICD-10-CM

## 2014-06-25 DIAGNOSIS — Z0000 Encounter for general adult medical examination without abnormal findings: Secondary | ICD-10-CM

## 2014-06-25 DIAGNOSIS — Z1231 Encounter for screening mammogram for malignant neoplasm of breast: Secondary | ICD-10-CM | POA: Insufficient documentation

## 2014-06-25 DIAGNOSIS — N760 Acute vaginitis: Principal | ICD-10-CM

## 2014-06-25 LAB — WET PREP, GENITAL
Trich, Wet Prep: NONE SEEN
Yeast Wet Prep HPF POC: NONE SEEN

## 2014-06-25 MED ORDER — METRONIDAZOLE 500 MG PO TABS: 500.0000 mg | ORAL_TABLET | Freq: Two times a day (BID) | ORAL | Status: DC

## 2014-06-25 NOTE — Telephone Encounter (Signed)
RX for BV sent to [facility].  Pt aware of positive clue cells.

## 2014-10-04 ENCOUNTER — Encounter: Payer: Self-pay | Admitting: Obstetrics & Gynecology

## 2016-08-31 ENCOUNTER — Other Ambulatory Visit (HOSPITAL_COMMUNITY): Payer: Self-pay | Admitting: Obstetrics & Gynecology

## 2016-08-31 DIAGNOSIS — Z1231 Encounter for screening mammogram for malignant neoplasm of breast: Secondary | ICD-10-CM

## 2016-09-13 ENCOUNTER — Ambulatory Visit: Payer: Self-pay

## 2016-09-14 ENCOUNTER — Ambulatory Visit
Admission: RE | Admit: 2016-09-14 | Discharge: 2016-09-14 | Disposition: A | Discharge status: Home / Self Care | Payer: BLUE CROSS/BLUE SHIELD | Source: Ambulatory Visit | Attending: Obstetrics & Gynecology | Admitting: Obstetrics & Gynecology

## 2016-09-14 DIAGNOSIS — Z1231 Encounter for screening mammogram for malignant neoplasm of breast: Secondary | ICD-10-CM

## 2016-11-01 ENCOUNTER — Encounter: Payer: Self-pay | Admitting: Obstetrics & Gynecology

## 2016-11-01 ENCOUNTER — Ambulatory Visit (INDEPENDENT_AMBULATORY_CARE_PROVIDER_SITE_OTHER): Payer: BLUE CROSS/BLUE SHIELD | Admitting: Obstetrics & Gynecology

## 2016-11-01 VITALS — BP 124/91 | HR 68 | Ht 64.0 in | Wt 213.0 lb

## 2016-11-01 DIAGNOSIS — N760 Acute vaginitis: Secondary | ICD-10-CM | POA: Diagnosis not present

## 2016-11-01 DIAGNOSIS — B9689 Other specified bacterial agents as the cause of diseases classified elsewhere: Secondary | ICD-10-CM

## 2016-11-01 MED ORDER — METRONIDAZOLE 500 MG PO TABS: 500.0000 mg | ORAL_TABLET | Freq: Two times a day (BID) | ORAL | 3 refills | Status: DC

## 2016-11-01 NOTE — Progress Notes (Signed)
   Subjective:    Patient ID: Mary Blankenship, female    DOB: 1966/02/21, 50 y.o.   MRN: [id number]  HPI  50 yo lady here with a recurrence of BV.  Review of Systems She had a hysterectomy in the past. Her mammogram is today    Objective:   Physical Exam  WNWHBFNAD Breathing, conversing, and ambulating normally Abd- benign Speculum exam reveals vaginal discharge c/w BV Bimanual exam reveals no tenderness or masses       Assessment & Plan:  BV- flagyl with refills given RTC for annual bimanual exams

## 2016-11-01 NOTE — Progress Notes (Signed)
Pt. Declined flu shot

## 2018-01-16 ENCOUNTER — Other Ambulatory Visit: Payer: Self-pay | Admitting: Internal Medicine

## 2018-01-16 DIAGNOSIS — Z1231 Encounter for screening mammogram for malignant neoplasm of breast: Secondary | ICD-10-CM

## 2018-02-04 ENCOUNTER — Ambulatory Visit
Admission: RE | Admit: 2018-02-04 | Discharge: 2018-02-04 | Disposition: A | Discharge status: Home / Self Care | Payer: BLUE CROSS/BLUE SHIELD | Source: Ambulatory Visit | Attending: Internal Medicine | Admitting: Internal Medicine

## 2018-02-04 DIAGNOSIS — Z1231 Encounter for screening mammogram for malignant neoplasm of breast: Secondary | ICD-10-CM

## 2018-08-20 ENCOUNTER — Encounter: Payer: Self-pay | Admitting: Nurse Practitioner

## 2018-08-20 DIAGNOSIS — R030 Elevated blood-pressure reading, without diagnosis of hypertension: Secondary | ICD-10-CM | POA: Insufficient documentation

## 2018-08-20 DIAGNOSIS — M25562 Pain in left knee: Secondary | ICD-10-CM

## 2018-08-20 DIAGNOSIS — E669 Obesity, unspecified: Secondary | ICD-10-CM

## 2018-09-04 ENCOUNTER — Ambulatory Visit (INDEPENDENT_AMBULATORY_CARE_PROVIDER_SITE_OTHER): Payer: BLUE CROSS/BLUE SHIELD | Admitting: Nurse Practitioner

## 2018-09-04 ENCOUNTER — Encounter: Payer: Self-pay | Admitting: Nurse Practitioner

## 2018-09-04 VITALS — BP 124/86 | HR 67 | Temp 97.9°F | Ht 63.5 in | Wt 221.0 lb

## 2018-09-04 DIAGNOSIS — Z1212 Encounter for screening for malignant neoplasm of rectum: Secondary | ICD-10-CM

## 2018-09-04 DIAGNOSIS — Z1211 Encounter for screening for malignant neoplasm of colon: Secondary | ICD-10-CM

## 2018-09-04 DIAGNOSIS — I10 Essential (primary) hypertension: Secondary | ICD-10-CM | POA: Diagnosis not present

## 2018-09-04 DIAGNOSIS — Z113 Encounter for screening for infections with a predominantly sexual mode of transmission: Secondary | ICD-10-CM

## 2018-09-04 DIAGNOSIS — E559 Vitamin D deficiency, unspecified: Secondary | ICD-10-CM

## 2018-09-04 DIAGNOSIS — Z0000 Encounter for general adult medical examination without abnormal findings: Secondary | ICD-10-CM | POA: Diagnosis not present

## 2018-09-04 DIAGNOSIS — E669 Obesity, unspecified: Secondary | ICD-10-CM

## 2018-09-04 DIAGNOSIS — Z23 Encounter for immunization: Secondary | ICD-10-CM

## 2018-09-04 LAB — POCT URINALYSIS DIPSTICK
BILIRUBIN UA: NEGATIVE
GLUCOSE UA: NEGATIVE
KETONES UA: NEGATIVE
LEUKOCYTES UA: NEGATIVE
Nitrite, UA: NEGATIVE
Protein, UA: NEGATIVE
RBC UA: NEGATIVE
SPEC GRAV UA: 1.02 (ref 1.010–1.025)
Urobilinogen, UA: NEGATIVE E.U./dL — AB
pH, UA: 5.5 (ref 5.0–8.0)

## 2018-09-04 NOTE — Patient Instructions (Signed)
Colorectal Cancer Screening Colorectal cancer screening is a group of tests used to check for colorectal cancer. Colorectal refers to your colon and rectum. Your colon and rectum are located at the end of your large intestine and carry your bowel movements out of your body. Why is colorectal cancer screening done? It is common for abnormal growths (polyps) to form in the lining of your colon, especially as you get older. These polyps can be cancerous or become cancerous. If colorectal cancer is found at an early stage, it is treatable. Who should be screened for colorectal cancer? Screening is recommended for all adults at average risk starting at age 52. Tests may be recommended every 1 to 10 years. Your health care provider may recommend earlier or more frequent screening if you have:  A history of colorectal cancer or polyps.  A family member with a history of colorectal cancer or polyps.  Inflammatory bowel disease, such as ulcerative colitis or Crohn disease.  A type of hereditary colon cancer syndrome.  Colorectal cancer symptoms.  Types of screening tests There are several types of colorectal screening tests. They include:  Guaiac-based fecal occult blood testing.  Fecal immunochemical test (FIT).  Stool DNA test.  Barium enema.  Virtual colonoscopy.  Sigmoidoscopy. During this test, a sigmoidoscope is used to examine your rectum and lower colon. A sigmoidoscope is a flexible tube with a camera that is inserted through your anus into your rectum and lower colon.  Colonoscopy. During this test, a colonoscope is used to examine your entire colon. A colonoscope is a long, thin, flexible tube with a camera. This test examines your entire colon and rectum.  This information is not intended to replace advice given to you by your health care provider. Make sure you discuss any questions you have with your health care provider. Document Released: 05/09/2010 Document Revised:  06/28/2016 Document Reviewed: 02/25/2014 Elsevier Interactive Patient Education  2018 [employer]. Tetanus Toxoid Adsorbed injection What is this medicine? TETANUS TOXOID (TET n uhs tok soid) is a vaccine. It is used to prevent infections of tetanus (lockjaw). This medicine may be used for other purposes; ask your health care provider or pharmacist if you have questions. What should I tell my health care provider before I take this medicine? They need to know if you have any of these conditions: -bleeding disorder -immune system problems -infection with fever -low levels of platelets in the blood -an unusual or allergic reaction to tetanus toxoid, vaccines, latex, thimerosal, aluminium, other medicines, foods, dyes, or preservatives -pregnant or trying to get pregnant -breast-feeding How should I use this medicine? This vaccine is for injection into a muscle. It is given by a health care professional. A copy of Vaccine Information Statements will be given before each vaccination. Read this sheet carefully each time. The sheet may change frequently. Talk to your pediatrician regarding the use of this medicine in children. While this drug may be prescribed for children as young as 65 years of age for selected conditions, precautions do apply. Overdosage: If you think you have taken too much of this medicine contact a poison control center or emergency room at once. NOTE: This medicine is only for you. Do not share this medicine with others. What if I miss a dose? Keep appointments for follow-up (booster) doses as directed. It is important not to miss your dose. Call your doctor or health care professional if you are unable to keep an appointment. What may interact with this medicine? -adalimumab -  anakinra -infliximab -medicines that suppress your immune system -medicines to treat cancer -steroid medicines like prednisone or cortisone This list may not describe all possible interactions.  Give your health care provider a list of all the medicines, herbs, non-prescription drugs, or dietary supplements you use. Also tell them if you smoke, drink alcohol, or use illegal drugs. Some items may interact with your medicine. What should I watch for while using this medicine? Report any adverse reaction following administration to your health care provider. Contact your doctor or health care professional and seek emergency medical care if any serious side effects occur. This vaccine, like all vaccines, may not fully protect everyone. What side effects may I notice from receiving this medicine? Side effects that you should report to your doctor or health care professional as soon as possible: -allergic reactions like skin rash, itching or hives, swelling of the face, lips, or tongue -arthritis pain -breathing problems -extreme changes in behavior -fast, irregular heartbeat -fever over 100 degrees F -pain, tingling, numbness in the hands or feet -seizures -unusually weak or tired Side effects that usually do not require medical attention (report to your doctor or health care professional if they continue or are bothersome): -aches or pains -bruising, pain, swelling at site where injected -low-grade fever of 100 degrees F or less -nausea This list may not describe all possible side effects. Call your doctor for medical advice about side effects. You may report side effects to FDA at 1-800-FDA-1088. Where should I keep my medicine? This drug is given in a hospital or clinic and will not be stored at home. NOTE: This sheet is a summary. It may not cover all possible information. If you have questions about this medicine, talk to your doctor, pharmacist, or health care provider.  2018 Elsevier/Gold Standard (2008-08-05 11:28:32)

## 2018-09-04 NOTE — Progress Notes (Signed)
EKG

## 2018-09-04 NOTE — Progress Notes (Signed)
Subjective:     Patient ID: Mary Blankenship , female    DOB: Jun 22, 1966 , 52 y.o.   MRN: [id number]   Here for HEALTH MAINTENANCE.  Hysterectomy 2013/2014.      Hypertension  This is a chronic problem. The current episode started more than 1 month ago. The problem is unchanged. The problem is controlled. Pertinent negatives include no anxiety. Risk factors for coronary artery disease include sedentary lifestyle and obesity. Compliance problems include exercise.     The patient states she uses post menopausal status for birth control. Last LMP was Patient's last menstrual period was 01/16/2012.. Negative for Dysmenorrhea and Negative for Menorrhagia. Negative for: breast discharge, breast lump(s), breast pain and breast self exam. Associated symptoms include abnormal vaginal bleeding. Pertinent negatives include abnormal bleeding (hematology), anxiety, decreased libido, depression, difficulty falling sleep, dyspareunia, history of infertility, nocturia, sexual dysfunction, sleep disturbances, urinary incontinence, urinary urgency, vaginal discharge and vaginal itching. Diet regular.The patient states her exercise level is  minimal  . The patient's tobacco use is:  Social History   Tobacco Use  Smoking Status Never Smoker  Smokeless Tobacco Never Used  . She has been exposed to passive smoke. The patient's alcohol use is:  Social History   Substance and Sexual Activity  Alcohol Use Yes  . Alcohol/week: 5.0 standard drinks  . Types: 5 Glasses of wine per week   Comment: ocassionally   . Additional information: menopause  Past Medical History:  Diagnosis Date  . Abnormal Pap smear    2010  . Obesity       Current Outpatient Medications:  .  Cholecalciferol (VITAMIN D3) 5000 units CAPS, Take 1 capsule by mouth once., Disp: , Rfl:  .  hydrochlorothiazide (HYDRODIURIL) 12.5 MG tablet, Take 12.5 mg by mouth daily., Disp: , Rfl:    Review of Systems  Constitutional: Negative.   HENT:  Negative.   Eyes: Negative.   Respiratory: Negative.   Cardiovascular: Negative.   Gastrointestinal: Negative.   Endocrine: Negative.   Genitourinary: Negative.   Musculoskeletal: Negative.   Skin: Negative.   Allergic/Immunologic: Negative.   Neurological: Negative.   Hematological: Negative.   Psychiatric/Behavioral: Negative.      Today's Vitals   09/04/18 1138  BP: 124/86  Pulse: 67  Temp: 97.9 F (36.6 C)  TempSrc: Oral  SpO2: 98%  Weight: 221 lb (100.2 kg)  Height: 5' 3.5" (1.613 m)  PainSc: 0-No pain   Body mass index is 38.53 kg/m.   Objective:  Physical Exam  Constitutional: She is oriented to person, place, and time. She appears well-developed and well-nourished.  Obese  HENT:  Head: Normocephalic and atraumatic.  Right Ear: External ear normal.  Left Ear: External ear normal.  Nose: Nose normal.  Mouth/Throat: Oropharynx is clear and moist.  Eyes: Pupils are equal, round, and reactive to light. Conjunctivae and EOM are normal.  Neck: Normal range of motion. Neck supple.  Cardiovascular: Normal rate, regular rhythm, normal heart sounds and intact distal pulses.  Pulmonary/Chest: Effort normal and breath sounds normal.  Abdominal: Soft. Bowel sounds are normal.  Musculoskeletal: Normal range of motion.  Neurological: She is alert and oriented to person, place, and time.  Skin: Skin is warm and dry. Capillary refill takes less than 2 seconds.  Psychiatric: She has a normal mood and affect.        Assessment And Plan:     1. Health maintenance examination  Full exam done, no PAP being followed by GYN  2.  Hypertension, unspecified type  Chronic, fair control  Slightly elevated systolic likely related to limited water intake   Encouraged to limit salt intake.  - EKG 12-Lead - BMP8+eGFR - CBC no Diff - Hemoglobin A1c - Vitamin D 1,25 Dihydroxy  3. Encounter for colorectal cancer screening  Will refer to GI for colon cancer screening. -  Ambulatory referral to Gastroenterology  4. Screening examination for STD (sexually transmitted disease)  - HIV antibody (with reflex) - T pallidum Screening [facility]  5. Obesity (BMI 35.0-39.9 without comorbidity)  Discussed importance of healthy diet low in sugar/starches  Encouraged to increase physical activity - BMP8+eGFR - CBC no Diff - Hemoglobin A1c - Vitamin D 1,25 Dihydroxy  6. Encounter for immunization  Tdap given in office.  - Tdap vaccine greater than or equal to 7yo IM  Minette Brine, FNP

## 2018-09-05 LAB — CBC
Hematocrit: 38 % (ref 34.0–46.6)
Hemoglobin: 13.3 g/dL (ref 11.1–15.9)
MCH: 31.3 pg (ref 26.6–33.0)
MCHC: 35 g/dL (ref 31.5–35.7)
MCV: 89 fL (ref 79–97)
PLATELETS: 313 10*3/uL (ref 150–450)
RBC: 4.25 x10E6/uL (ref 3.77–5.28)
RDW: 12.9 % (ref 12.3–15.4)
WBC: 5.8 10*3/uL (ref 3.4–10.8)

## 2018-09-05 LAB — HIV ANTIBODY (ROUTINE TESTING W REFLEX): HIV SCREEN 4TH GENERATION: NONREACTIVE

## 2018-09-05 LAB — HEMOGLOBIN A1C
ESTIMATED AVERAGE GLUCOSE: 97 mg/dL
Hgb A1c MFr Bld: 5 % (ref 4.8–5.6)

## 2018-09-05 LAB — T PALLIDUM SCREENING CASCADE: T PALLIDUM ANTIBODIES (TP-PA): NEGATIVE

## 2018-09-05 LAB — VITAMIN D 25 HYDROXY (VIT D DEFICIENCY, FRACTURES): Vit D, 25-Hydroxy: 43.4 ng/mL (ref 30.0–100.0)

## 2018-10-09 ENCOUNTER — Telehealth: Payer: Self-pay | Admitting: *Deleted

## 2018-10-09 MED ORDER — METRONIDAZOLE 500 MG PO TABS: 500.0000 mg | ORAL_TABLET | Freq: Two times a day (BID) | ORAL | 1 refills | Status: DC

## 2018-10-09 NOTE — Telephone Encounter (Signed)
Pt called stating that she has aBV again.  She sees Dr Hulan Fray on a regular basis for this and per her chart Dr Hulan Fray told her that if she gets another one to call the office and a RX would be sent into her pharmacy.  RX sent to [facility] for Flagyl

## 2018-12-01 ENCOUNTER — Other Ambulatory Visit: Payer: Self-pay | Admitting: Nurse Practitioner

## 2019-04-13 ENCOUNTER — Telehealth: Payer: Self-pay

## 2019-04-13 NOTE — Telephone Encounter (Signed)
Pt consented to virtual appt 04/13/19

## 2019-04-14 ENCOUNTER — Encounter: Payer: Self-pay | Admitting: Nurse Practitioner

## 2019-04-14 ENCOUNTER — Ambulatory Visit: Payer: BLUE CROSS/BLUE SHIELD | Admitting: Nurse Practitioner

## 2019-04-14 ENCOUNTER — Other Ambulatory Visit: Payer: Self-pay

## 2019-04-14 DIAGNOSIS — I10 Essential (primary) hypertension: Secondary | ICD-10-CM

## 2019-04-14 HISTORY — DX: Essential (primary) hypertension: I10

## 2019-04-14 MED ORDER — HYDROCHLOROTHIAZIDE 12.5 MG PO TABS: 12.5000 mg | ORAL_TABLET | Freq: Every day | ORAL | 1 refills | Status: AC

## 2019-04-14 NOTE — Progress Notes (Addendum)
Virtual Visit via Video (Doxy[employer]me)  This visit type was conducted due to national recommendations for restrictions regarding the COVID-19 Pandemic (e.g. social distancing) in an effort to limit this patient's exposure and mitigate transmission in our community.  Patients identity confirmed using two different identifiers.  This format is felt to be most appropriate for this patient at this time.  All issues noted in this document were discussed and addressed.  No physical exam was performed (except for noted visual exam findings with Video Visits).    Date:  04/14/2019   ID:  Mary Blankenship, DOB 12-10-1965, MRN [id number]  Patient Location:  Home - spoke with Mary Blankenship  Provider location:   Office    Chief Complaint:  HTN follow up  History of Present Illness:    Mary Blankenship is a 53 y.o. female who presents via video conferencing for a telehealth visit today.   The patient does not have symptoms concerning for COVID-19 infection (fever, chills, cough, or new shortness of breath).   Hypertension  This is a chronic problem. The problem is controlled. Pertinent negatives include no anxiety, chest pain, headaches or shortness of breath. Risk factors for coronary artery disease include obesity and sedentary lifestyle. Past treatments include diuretics. The current treatment provides no improvement. There is no history of angina. There is no history of chronic renal disease.     Past Medical History:  Diagnosis Date  . Abnormal Pap smear    2010  . Hypertension 04/14/2019  . Obesity    Past Surgical History:  Procedure Laterality Date  . ABDOMINAL HYSTERECTOMY    . CYSTOSCOPY  02/19/2012   Procedure: CYSTOSCOPY;  Surgeon: Emily Filbert, MD;  Location: [facility];  Service: Gynecology;  Laterality: N/A;  . TUBAL LIGATION     2005     Current Meds  Medication Sig  . Cholecalciferol (VITAMIN D3) 5000 units CAPS Take 1 capsule by mouth once.  . hydrochlorothiazide (HYDRODIURIL)  12.5 MG tablet Take 1 tablet (12.5 mg total) by mouth daily.  . [DISCONTINUED] hydrochlorothiazide (HYDRODIURIL) 12.5 MG tablet TAKE 1 TABLET BY MOUTH ONCE DAILY     Allergies:   Patient has no known allergies.   Social History   Tobacco Use  . Smoking status: Never Smoker  . Smokeless tobacco: Never Used  Substance Use Topics  . Alcohol use: Yes    Alcohol/week: 5.0 standard drinks    Types: 5 Glasses of wine per week    Comment: ocassionally   . Drug use: No     Family Hx: The patient's family history includes Cancer in her paternal grandmother; Diabetes in her maternal grandmother and paternal grandmother.  ROS:   Please see the history of present illness.    Review of Systems  Constitutional: Negative.   Respiratory: Negative.  Negative for cough, shortness of breath and wheezing.   Cardiovascular: Negative.  Negative for chest pain.  Neurological: Negative for dizziness, tingling and headaches.    All other systems reviewed and are negative.   Labs/Other Tests and Data Reviewed:    Recent Labs: 09/04/2018: Hemoglobin 13.3; Platelets 313   Recent Lipid Panel No results found for: CHOL, TRIG, HDL, CHOLHDL, LDLCALC, LDLDIRECT  Wt Readings from Last 3 Encounters:  09/04/18 221 lb (100.2 kg)  02/12/18 223 lb 4 oz (101.3 kg)  11/01/16 213 lb (96.6 kg)     Exam:    Vital Signs:  LMP 01/16/2012     Physical Exam  Constitutional: She is  oriented to person, place, and time and well-developed, well-nourished, and in no distress.  Cardiovascular:  No JVD or edema noted  Pulmonary/Chest: Effort normal.  Neurological: She is alert and oriented to person, place, and time.  Psychiatric: Mood, memory, affect and judgment normal.    ASSESSMENT & PLAN:    1. Hypertension, unspecified type  She was not able to check her blood pressure prior to the visit and will check later today and send a message.  Continue with current medications  Encouraged to make sure she  is well hydrated and to move more especially due to not being as active with COVID-19   COVID-19 Education: The signs and symptoms of COVID-19 were discussed with the patient and how to seek care for testing (follow up with PCP or arrange E-visit).  The importance of social distancing was discussed today.  Patient Risk:   After full review of this patients clinical status, I feel that they are at least moderate risk at this time.  Time:   Today, I have spent 9 minutes/ seconds with the patient with telehealth technology discussing above diagnoses.     Medication Adjustments/Labs and Tests Ordered: Current medicines are reviewed at length with the patient today.  Concerns regarding medicines are outlined above.   Tests Ordered: No orders of the defined types were placed in this encounter.   Medication Changes: Meds ordered this encounter  Medications  . hydrochlorothiazide (HYDRODIURIL) 12.5 MG tablet    Sig: Take 1 tablet (12.5 mg total) by mouth daily.    Dispense:  90 tablet    Refill:  1    Disposition:  Follow up has appt in October.  Signed, Minette Brine, FNP

## 2019-04-15 ENCOUNTER — Other Ambulatory Visit: Payer: BLUE CROSS/BLUE SHIELD

## 2019-04-15 ENCOUNTER — Other Ambulatory Visit: Payer: Self-pay

## 2019-04-15 DIAGNOSIS — I10 Essential (primary) hypertension: Secondary | ICD-10-CM

## 2019-04-15 NOTE — Addendum Note (Signed)
Addended by: Minette Brine F on: 04/15/2019 04:08 PM   Modules accepted: Orders

## 2019-04-16 LAB — BMP8+EGFR
BUN/Creatinine Ratio: 15 (ref 9–23)
BUN: 12 mg/dL (ref 6–24)
CO2: 28 mmol/L (ref 20–29)
Calcium: 9.7 mg/dL (ref 8.7–10.2)
Chloride: 101 mmol/L (ref 96–106)
Creatinine, Ser: 0.8 mg/dL (ref 0.57–1.00)
GFR calc Af Amer: 99 mL/min/{1.73_m2} (ref >59)
GFR calc non Af Amer: 86 mL/min/{1.73_m2} (ref >59)
Glucose: 88 mg/dL (ref 65–99)
Potassium: 4.8 mmol/L (ref 3.5–5.2)
Sodium: 144 mmol/L (ref 134–144)

## 2019-04-28 ENCOUNTER — Telehealth: Payer: Self-pay

## 2019-04-28 NOTE — Telephone Encounter (Signed)
Patient called requesting a refill on her hydrochlorothiazide. Rx was sent back on 05/12. I called pharmacy to check if it was there and it is. I called and notified pt. YRL,RMA

## 2019-07-13 ENCOUNTER — Telehealth: Payer: Self-pay | Admitting: *Deleted

## 2019-07-13 MED ORDER — METRONIDAZOLE 500 MG PO TABS: 500.0000 mg | ORAL_TABLET | Freq: Two times a day (BID) | ORAL | 0 refills | Status: DC

## 2019-07-13 NOTE — Telephone Encounter (Signed)
Pt called stating that she has all the symptoms of BV odor, discharge.  Per Dr Alease Medina notes because pt has chronic BV she will send in prescription for Flagyl to [facility] in [place].

## 2019-07-15 ENCOUNTER — Telehealth: Payer: Self-pay

## 2019-07-15 DIAGNOSIS — N760 Acute vaginitis: Secondary | ICD-10-CM

## 2019-07-15 DIAGNOSIS — B9689 Other specified bacterial agents as the cause of diseases classified elsewhere: Secondary | ICD-10-CM

## 2019-07-15 MED ORDER — METRONIDAZOLE 500 MG PO TABS: 500.0000 mg | ORAL_TABLET | Freq: Two times a day (BID) | ORAL | 0 refills | Status: DC

## 2019-07-15 NOTE — Telephone Encounter (Signed)
Rx was sent for Flagyl yesterday. Pt states it went to the wrong pharmacy. Pharmacy corrected and new Rx sent.

## 2019-09-07 ENCOUNTER — Other Ambulatory Visit: Payer: Self-pay | Admitting: Internal Medicine

## 2019-09-07 ENCOUNTER — Other Ambulatory Visit: Payer: Self-pay

## 2019-09-07 ENCOUNTER — Encounter: Payer: Self-pay | Admitting: Nurse Practitioner

## 2019-09-07 ENCOUNTER — Ambulatory Visit: Payer: BC Managed Care – PPO | Admitting: Nurse Practitioner

## 2019-09-07 VITALS — BP 120/76 | HR 83 | Temp 98.1°F | Ht 64.0 in | Wt 226.6 lb

## 2019-09-07 DIAGNOSIS — I10 Essential (primary) hypertension: Secondary | ICD-10-CM | POA: Diagnosis not present

## 2019-09-07 DIAGNOSIS — Z0000 Encounter for general adult medical examination without abnormal findings: Secondary | ICD-10-CM | POA: Diagnosis not present

## 2019-09-07 DIAGNOSIS — Z1231 Encounter for screening mammogram for malignant neoplasm of breast: Secondary | ICD-10-CM

## 2019-09-07 DIAGNOSIS — E669 Obesity, unspecified: Secondary | ICD-10-CM

## 2019-09-07 DIAGNOSIS — Z1211 Encounter for screening for malignant neoplasm of colon: Secondary | ICD-10-CM

## 2019-09-07 LAB — POCT UA - MICROALBUMIN
Albumin/Creatinine Ratio, Urine, POC: 30
Creatinine, POC: 100 mg/dL
Microalbumin Ur, POC: 10 mg/L

## 2019-09-07 LAB — POCT URINALYSIS DIPSTICK
Bilirubin, UA: NEGATIVE
Blood, UA: NEGATIVE
Glucose, UA: NEGATIVE
Ketones, UA: NEGATIVE
Leukocytes, UA: NEGATIVE
Nitrite, UA: NEGATIVE
Protein, UA: NEGATIVE
Spec Grav, UA: 1.01 (ref 1.010–1.025)
Urobilinogen, UA: 0.2 E.U./dL
pH, UA: 7 (ref 5.0–8.0)

## 2019-09-07 NOTE — Progress Notes (Addendum)
Subjective:     Patient ID: Mary Blankenship , female    DOB: 07-27-1966 , 53 y.o.   MRN: [id number]   Chief Complaint  Patient presents with  . Annual Exam    HPI  Here for HM   The patient states she has had a hysterectomy in 2013 for birth control.  Mammogram last done 01/2018.  Negative for: breast discharge, breast lump(s), breast pain and breast self exam.  Pertinent negatives include abnormal bleeding (hematology), anxiety, decreased libido, depression, difficulty falling sleep, dyspareunia, history of infertility, nocturia, sexual dysfunction, sleep disturbances, urinary incontinence, urinary urgency, vaginal discharge and vaginal itching. Diet regular. The patient states her exercise level is  none.  She did order a row machine recently.     The patient's tobacco use is:  Social History   Tobacco Use  Smoking Status Never Smoker  Smokeless Tobacco Never Used   She has been exposed to passive smoke. The patient's alcohol use is:  Social History   Substance and Sexual Activity  Alcohol Use Yes  . Alcohol/week: 5.0 standard drinks  . Types: 5 Glasses of wine per week   Comment: ocassionally    Additional information: Last pap 2 years ago - [facility] - she has had a hysterectomy,   Past Medical History:  Diagnosis Date  . Abnormal Pap smear    2010  . Hypertension 04/14/2019  . Obesity      Family History  Problem Relation Age of Onset  . Diabetes Paternal Grandmother   . Cancer Paternal Grandmother   . Diabetes Maternal Grandmother      Current Outpatient Medications:  .  Cholecalciferol (VITAMIN D3) 5000 units CAPS, Take 1 capsule by mouth once., Disp: , Rfl:  .  hydrochlorothiazide (HYDRODIURIL) 12.5 MG tablet, Take 1 tablet (12.5 mg total) by mouth daily., Disp: 90 tablet, Rfl: 1   No Known Allergies   Review of Systems  Constitutional: Negative.   HENT: Negative.   Eyes: Negative.   Respiratory: Negative.   Cardiovascular: Negative.  Negative for  chest pain, palpitations and leg swelling.  Gastrointestinal: Negative.   Endocrine: Negative.   Musculoskeletal: Negative.   Skin: Negative.   Allergic/Immunologic: Negative.   Neurological: Negative.  Negative for dizziness and headaches.  Hematological: Negative.   Psychiatric/Behavioral: Negative.  Negative for agitation and confusion.     Today's Vitals   09/07/19 1012  BP: 120/76  Pulse: 83  Temp: 98.1 F (36.7 C)  TempSrc: Oral  Weight: 226 lb 9.6 oz (102.8 kg)  Height: 5\' 4"  (1.626 m)  PainSc: 3   PainLoc: Back   Body mass index is 38.9 kg/m.   Objective:  Physical Exam Constitutional:      Appearance: Normal appearance. She is well-developed.  HENT:     Head: Normocephalic and atraumatic.     Right Ear: Hearing, tympanic membrane, ear canal and external ear normal.     Left Ear: Hearing, tympanic membrane, ear canal and external ear normal.  Eyes:     General: Lids are normal.     Extraocular Movements: Extraocular movements intact.     Conjunctiva/sclera: Conjunctivae normal.     Pupils: Pupils are equal, round, and reactive to light.     Funduscopic exam:    Right eye: No papilledema.        Left eye: No papilledema.  Neck:     Musculoskeletal: Full passive range of motion without pain, normal range of motion and neck supple.  Thyroid: No thyroid mass.     Vascular: No carotid bruit.  Cardiovascular:     Rate and Rhythm: Normal rate and regular rhythm.     Pulses: Normal pulses.     Heart sounds: Normal heart sounds. No murmur.  Pulmonary:     Effort: Pulmonary effort is normal.     Breath sounds: Normal breath sounds.  Abdominal:     General: Abdomen is flat. Bowel sounds are normal.     Palpations: Abdomen is soft.  Musculoskeletal: Normal range of motion.        General: No swelling.     Right lower leg: No edema.     Left lower leg: No edema.  Skin:    General: Skin is warm and dry.     Capillary Refill: Capillary refill takes less than  2 seconds.  Neurological:     General: No focal deficit present.     Mental Status: She is alert and oriented to person, place, and time.     Cranial Nerves: No cranial nerve deficit.     Sensory: No sensory deficit.  Psychiatric:        Mood and Affect: Mood normal.        Behavior: Behavior normal.        Thought Content: Thought content normal.        Judgment: Judgment normal.         Assessment And Plan:     1. Hypertension, unspecified type . B/P is controlled.  . CMP ordered to check renal function.  . The importance of regular exercise and dietary modification was stressed to the patient.  . Stressed importance of losing ten percent of her body weight to help with B/P control.  . EKG done with NSR HR 63 - POCT Urinalysis Dipstick (81002) - POCT UA - Microalbumin - EKG 12-Lead - CBC no Diff - CMP14 + Anion Gap  2. Encounter for screening for malignant neoplasm of colon According to USPTF Colorectal cancer Screening guidelines. Colonoscopy is recommended every 10 years, starting at age 67years. Will refer to GI for colon cancer screening. - Ambulatory referral to Gastroenterology - VITAMIN D 25 Hydroxy (Vit-D Deficiency, Fractures)  3. Health maintenance examination . Behavior modifications discussed and diet history reviewed.   . Pt will continue to exercise regularly and modify diet with low GI, plant based foods and decrease intake of processed foods.  . Recommend intake of daily multivitamin, Vitamin D, and calcium.  . Recommend colonoscopy for preventive screenings, as well as recommend immunizations that include influenza, TDAP - Hemoglobin A1c - Lipid panel  4. Screening mammogram, encounter for  Pt instructed on Self Breast Exam.According to ACOG guidelines Women aged 60 and older are recommended to get an annual mammogram. Form completed and given to patient contact the [facility] for appointment scheduing.   Pt encouraged to get annual  mammogram - MM Digital Screening; Future  5. Obesity (BMI 35.0-39.9 without comorbidity)  Discussed the importance of increasing her physical activity and avoiding foods high in carbohydrates and processed foods.  - TSH - Insulin, random(561)      Influenza immunization was not given due to patient refusal.  Minette Brine, FNP    THE PATIENT IS ENCOURAGED TO PRACTICE SOCIAL DISTANCING DUE TO THE COVID-19 PANDEMIC.

## 2019-09-07 NOTE — Patient Instructions (Addendum)
Health Maintenance  Topic Date Due   PAP SMEAR-Modifier  05/29/2014   COLONOSCOPY  11/26/2017   INFLUENZA VACCINE  03/02/2020 (Originally 07/04/2019)   MAMMOGRAM  02/05/2020   TETANUS/TDAP  09/04/2028   HIV Screening  Completed   Health Maintenance, Female Adopting a healthy lifestyle and getting preventive care are important in promoting health and wellness. Ask your health care provider about:  The right schedule for you to have regular tests and exams.  Things you can do on your own to prevent diseases and keep yourself healthy. What should I know about diet, weight, and exercise? Eat a healthy diet   Eat a diet that includes plenty of vegetables, fruits, low-fat dairy products, and lean protein.  Do not eat a lot of foods that are high in solid fats, added sugars, or sodium. Maintain a healthy weight Body mass index (BMI) is used to identify weight problems. It estimates body fat based on height and weight. Your health care provider can help determine your BMI and help you achieve or maintain a healthy weight. Get regular exercise Get regular exercise. This is one of the most important things you can do for your health. Most adults should:  Exercise for at least 150 minutes each week. The exercise should increase your heart rate and make you sweat (moderate-intensity exercise).  Do strengthening exercises at least twice a week. This is in addition to the moderate-intensity exercise.  Spend less time sitting. Even light physical activity can be beneficial. Watch cholesterol and blood lipids Have your blood tested for lipids and cholesterol at 52 years of age, then have this test every 5 years. Have your cholesterol levels checked more often if:  Your lipid or cholesterol levels are high.  You are older than 52 years of age.  You are at high risk for heart disease. What should I know about cancer screening? Depending on your health history and family history, you may  need to have cancer screening at various ages. This may include screening for:  Breast cancer.  Cervical cancer.  Colorectal cancer.  Skin cancer.  Lung cancer. What should I know about heart disease, diabetes, and high blood pressure? Blood pressure and heart disease  High blood pressure causes heart disease and increases the risk of stroke. This is more likely to develop in people who have high blood pressure readings, are of African descent, or are overweight.  Have your blood pressure checked: ? Every 3-5 years if you are 36-78 years of age. ? Every year if you are 67 years old or older. Diabetes Have regular diabetes screenings. This checks your fasting blood sugar level. Have the screening done:  Once every three years after age 66 if you are at a normal weight and have a low risk for diabetes.  More often and at a younger age if you are overweight or have a high risk for diabetes. What should I know about preventing infection? Hepatitis B If you have a higher risk for hepatitis B, you should be screened for this virus. Talk with your health care provider to find out if you are at risk for hepatitis B infection. Hepatitis C Testing is recommended for:  Everyone born from [birth year] through 1964.  Anyone with known risk factors for hepatitis C. Sexually transmitted infections (STIs)  Get screened for STIs, including gonorrhea and chlamydia, if: ? You are sexually active and are younger than 52 years of age. ? You are older than 52 years of age and  your health care provider tells you that you are at risk for this type of infection. ? Your sexual activity has changed since you were last screened, and you are at increased risk for chlamydia or gonorrhea. Ask your health care provider if you are at risk.  Ask your health care provider about whether you are at high risk for HIV. Your health care provider may recommend a prescription medicine to help prevent HIV infection. If you  choose to take medicine to prevent HIV, you should first get tested for HIV. You should then be tested every 3 months for as long as you are taking the medicine. Pregnancy  If you are about to stop having your period (premenopausal) and you may become pregnant, seek counseling before you get pregnant.  Take 400 to 800 micrograms (mcg) of folic acid every day if you become pregnant.  Ask for birth control (contraception) if you want to prevent pregnancy. Osteoporosis and menopause Osteoporosis is a disease in which the bones lose minerals and strength with aging. This can result in bone fractures. If you are 26 years old or older, or if you are at risk for osteoporosis and fractures, ask your health care provider if you should:  Be screened for bone loss.  Take a calcium or vitamin D supplement to lower your risk of fractures.  Be given hormone replacement therapy (HRT) to treat symptoms of menopause. Follow these instructions at home: Lifestyle  Do not use any products that contain nicotine or tobacco, such as cigarettes, e-cigarettes, and chewing tobacco. If you need help quitting, ask your health care provider.  Do not use street drugs.  Do not share needles.  Ask your health care provider for help if you need support or information about quitting drugs. Alcohol use  Do not drink alcohol if: ? Your health care provider tells you not to drink. ? You are pregnant, may be pregnant, or are planning to become pregnant.  If you drink alcohol: ? Limit how much you use to 0-1 drink a day. ? Limit intake if you are breastfeeding.  Be aware of how much alcohol is in your drink. In the [place] one drink equals one 12 oz bottle of beer (355 mL), one 5 oz glass of wine (148 mL), or one 1 oz glass of hard liquor (44 mL). General instructions  Schedule regular health, dental, and eye exams.  Stay current with your vaccines.  Tell your health care provider if: ? You often feel  depressed. ? You have ever been abused or do not feel safe at home. Summary  Adopting a healthy lifestyle and getting preventive care are important in promoting health and wellness.  Follow your health care provider's instructions about healthy diet, exercising, and getting tested or screened for diseases.  Follow your health care provider's instructions on monitoring your cholesterol and blood pressure. This information is not intended to replace advice given to you by your health care provider. Make sure you discuss any questions you have with your health care provider. Document Released: 06/04/2011 Document Revised: 11/12/2018 Document Reviewed: 11/12/2018 Elsevier Patient Education  2020 [employer].    Exercising to Lose Weight Exercise is structured, repetitive physical activity to improve fitness and health. Getting regular exercise is important for everyone. It is especially important if you are overweight. Being overweight increases your risk of heart disease, stroke, diabetes, high blood pressure, and several types of cancer. Reducing your calorie intake and exercising can help you lose weight. Exercise  is usually categorized as moderate or vigorous intensity. To lose weight, most people need to do a certain amount of moderate-intensity or vigorous-intensity exercise each week. Moderate-intensity exercise  Moderate-intensity exercise is any activity that gets you moving enough to burn at least three times more energy (calories) than if you were sitting. Examples of moderate exercise include:  Walking a mile in 15 minutes.  Doing light yard work.  Biking at an easy pace. Most people should get at least 150 minutes (2 hours and 30 minutes) a week of moderate-intensity exercise to maintain their body weight. Vigorous-intensity exercise Vigorous-intensity exercise is any activity that gets you moving enough to burn at least six times more calories than if you were sitting. When  you exercise at this intensity, you should be working hard enough that you are not able to carry on a conversation. Examples of vigorous exercise include:  Running.  Playing a team sport, such as football, basketball, and soccer.  Jumping rope. Most people should get at least 75 minutes (1 hour and 15 minutes) a week of vigorous-intensity exercise to maintain their body weight. How can exercise affect me? When you exercise enough to burn more calories than you eat, you lose weight. Exercise also reduces body fat and builds muscle. The more muscle you have, the more calories you burn. Exercise also:  Improves mood.  Reduces stress and tension.  Improves your overall fitness, flexibility, and endurance.  Increases bone strength. The amount of exercise you need to lose weight depends on:  Your age.  The type of exercise.  Any health conditions you have.  Your overall physical ability. Talk to your health care provider about how much exercise you need and what types of activities are safe for you. What actions can I take to lose weight? Nutrition   Make changes to your diet as told by your health care provider or diet and nutrition specialist (dietitian). This may include: ? Eating fewer calories. ? Eating more protein. ? Eating less unhealthy fats. ? Eating a diet that includes fresh fruits and vegetables, whole grains, low-fat dairy products, and lean protein. ? Avoiding foods with added fat, salt, and sugar.  Drink plenty of water while you exercise to prevent dehydration or heat stroke. Activity  Choose an activity that you enjoy and set realistic goals. Your health care provider can help you make an exercise plan that works for you.  Exercise at a moderate or vigorous intensity most days of the week. ? The intensity of exercise may vary from person to person. You can tell how intense a workout is for you by paying attention to your breathing and heartbeat. Most people  will notice their breathing and heartbeat get faster with more intense exercise.  Do resistance training twice each week, such as: ? Push-ups. ? Sit-ups. ? Lifting weights. ? Using resistance bands.  Getting short amounts of exercise can be just as helpful as long structured periods of exercise. If you have trouble finding time to exercise, try to include exercise in your daily routine. ? Get up, stretch, and walk around every 30 minutes throughout the day. ? Go for a walk during your lunch break. ? Park your car farther away from your destination. ? If you take public transportation, get off one stop early and walk the rest of the way. ? Make phone calls while standing up and walking around. ? Take the stairs instead of elevators or escalators.  Wear comfortable clothes and shoes with good  support.  Do not exercise so much that you hurt yourself, feel dizzy, or get very short of breath. Where to find more information  [place] Department of Health and Human Services: [employer]  Centers for Disease Control and Prevention (CDC): http://www.wolf.info/ Contact a health care provider:  Before starting a new exercise program.  If you have questions or concerns about your weight.  If you have a medical problem that keeps you from exercising. Get help right away if you have any of the following while exercising:  Injury.  Dizziness.  Difficulty breathing or shortness of breath that does not go away when you stop exercising.  Chest pain.  Rapid heartbeat. Summary  Being overweight increases your risk of heart disease, stroke, diabetes, high blood pressure, and several types of cancer.  Losing weight happens when you burn more calories than you eat.  Reducing the amount of calories you eat in addition to getting regular moderate or vigorous exercise each week helps you lose weight. This information is not intended to replace advice given to you by your health care provider. Make sure you  discuss any questions you have with your health care provider. Document Released: 12/22/2010 Document Revised: 12/02/2017 Document Reviewed: 12/02/2017 Elsevier Patient Education  2020 [employer].  Back Exercises These exercises help to make your trunk and back strong. They also help to keep the lower back flexible. Doing these exercises can help to prevent back pain or lessen existing pain.  If you have back pain, try to do these exercises 2-3 times each day or as told by your doctor.  As you get better, do the exercises once each day. Repeat the exercises more often as told by your doctor.  To stop back pain from coming back, do the exercises once each day, or as told by your doctor. Exercises Single knee to chest Do these steps 3-5 times in a row for each leg: 1. Lie on your back on a firm bed or the floor with your legs stretched out. 2. Bring one knee to your chest. 3. Grab your knee or thigh with both hands and hold them it in place. 4. Pull on your knee until you feel a gentle stretch in your lower back or buttocks. 5. Keep doing the stretch for 10-30 seconds. 6. Slowly let go of your leg and straighten it. Pelvic tilt Do these steps 5-10 times in a row: 1. Lie on your back on a firm bed or the floor with your legs stretched out. 2. Bend your knees so they point up to the ceiling. Your feet should be flat on the floor. 3. Tighten your lower belly (abdomen) muscles to press your lower back against the floor. This will make your tailbone point up to the ceiling instead of pointing down to your feet or the floor. 4. Stay in this position for 5-10 seconds while you gently tighten your muscles and breathe evenly. Cat-cow Do these steps until your lower back bends more easily: 1. Get on your hands and knees on a firm surface. Keep your hands under your shoulders, and keep your knees under your hips. You may put padding under your knees. 2. Let your head hang down toward your chest.  Tighten (contract) the muscles in your belly. Point your tailbone toward the floor so your lower back becomes rounded like the back of a cat. 3. Stay in this position for 5 seconds. 4. Slowly lift your head. Let the muscles of your belly relax. Point your  tailbone up toward the ceiling so your back forms a sagging arch like the back of a cow. 5. Stay in this position for 5 seconds.  Press-ups Do these steps 5-10 times in a row: 1. Lie on your belly (face-down) on the floor. 2. Place your hands near your head, about shoulder-width apart. 3. While you keep your back relaxed and keep your hips on the floor, slowly straighten your arms to raise the top half of your body and lift your shoulders. Do not use your back muscles. You may change where you place your hands in order to make yourself more comfortable. 4. Stay in this position for 5 seconds. 5. Slowly return to lying flat on the floor.  Bridges Do these steps 10 times in a row: 1. Lie on your back on a firm surface. 2. Bend your knees so they point up to the ceiling. Your feet should be flat on the floor. Your arms should be flat at your sides, next to your body. 3. Tighten your butt muscles and lift your butt off the floor until your waist is almost as high as your knees. If you do not feel the muscles working in your butt and the back of your thighs, slide your feet 1-2 inches farther away from your butt. 4. Stay in this position for 3-5 seconds. 5. Slowly lower your butt to the floor, and let your butt muscles relax. If this exercise is too easy, try doing it with your arms crossed over your chest. Belly crunches Do these steps 5-10 times in a row: 1. Lie on your back on a firm bed or the floor with your legs stretched out. 2. Bend your knees so they point up to the ceiling. Your feet should be flat on the floor. 3. Cross your arms over your chest. 4. Tip your chin a little bit toward your chest but do not bend your neck. 5. Tighten  your belly muscles and slowly raise your chest just enough to lift your shoulder blades a tiny bit off of the floor. Avoid raising your body higher than that, because it can put too much stress on your low back. 6. Slowly lower your chest and your head to the floor. Back lifts Do these steps 5-10 times in a row: 1. Lie on your belly (face-down) with your arms at your sides, and rest your forehead on the floor. 2. Tighten the muscles in your legs and your butt. 3. Slowly lift your chest off of the floor while you keep your hips on the floor. Keep the back of your head in line with the curve in your back. Look at the floor while you do this. 4. Stay in this position for 3-5 seconds. 5. Slowly lower your chest and your face to the floor. Contact a doctor if:  Your back pain gets a lot worse when you do an exercise.  Your back pain does not get better 2 hours after you exercise. If you have any of these problems, stop doing the exercises. Do not do them again unless your doctor says it is okay. Get help right away if:  You have sudden, very bad back pain. If this happens, stop doing the exercises. Do not do them again unless your doctor says it is okay. This information is not intended to replace advice given to you by your health care provider. Make sure you discuss any questions you have with your health care provider. Document Released: 12/22/2010 Document Revised: 08/14/2018  Document Reviewed: 08/14/2018 Elsevier Patient Education  [employer].

## 2019-09-08 LAB — INSULIN, RANDOM: INSULIN: 5.2 u[IU]/mL (ref 2.6–24.9)

## 2019-09-08 LAB — CMP14 + ANION GAP
ALT: 11 IU/L (ref 0–32)
AST: 15 IU/L (ref 0–40)
Albumin/Globulin Ratio: 1.6 (ref 1.2–2.2)
Albumin: 4.4 g/dL (ref 3.8–4.9)
Alkaline Phosphatase: 65 IU/L (ref 39–117)
Anion Gap: 15 mmol/L (ref 10.0–18.0)
BUN/Creatinine Ratio: 13 (ref 9–23)
BUN: 11 mg/dL (ref 6–24)
Bilirubin Total: 0.5 mg/dL (ref 0.0–1.2)
CO2: 24 mmol/L (ref 20–29)
Calcium: 9.7 mg/dL (ref 8.7–10.2)
Chloride: 103 mmol/L (ref 96–106)
Creatinine, Ser: 0.83 mg/dL (ref 0.57–1.00)
GFR calc Af Amer: 94 mL/min/{1.73_m2} (ref >59)
GFR calc non Af Amer: 82 mL/min/{1.73_m2} (ref >59)
Globulin, Total: 2.8 g/dL (ref 1.5–4.5)
Glucose: 83 mg/dL (ref 65–99)
Potassium: 3.9 mmol/L (ref 3.5–5.2)
Sodium: 142 mmol/L (ref 134–144)
Total Protein: 7.2 g/dL (ref 6.0–8.5)

## 2019-09-08 LAB — TSH: TSH: 1.16 u[IU]/mL (ref 0.450–4.500)

## 2019-09-08 LAB — HEMOGLOBIN A1C
Est. average glucose Bld gHb Est-mCnc: 108 mg/dL
Hgb A1c MFr Bld: 5.4 % (ref 4.8–5.6)

## 2019-09-08 LAB — VITAMIN D 25 HYDROXY (VIT D DEFICIENCY, FRACTURES): Vit D, 25-Hydroxy: 46.6 ng/mL (ref 30.0–100.0)

## 2019-09-08 LAB — LIPID PANEL
Chol/HDL Ratio: 2.4 ratio (ref 0.0–4.4)
Cholesterol, Total: 188 mg/dL (ref 100–199)
HDL: 77 mg/dL (ref >39)
LDL Chol Calc (NIH): 100 mg/dL — ABNORMAL HIGH (ref 0–99)
Triglycerides: 56 mg/dL (ref 0–149)
VLDL Cholesterol Cal: 11 mg/dL (ref 5–40)

## 2019-09-08 LAB — CBC
Hematocrit: 38.6 % (ref 34.0–46.6)
Hemoglobin: 13.4 g/dL (ref 11.1–15.9)
MCH: 30.5 pg (ref 26.6–33.0)
MCHC: 34.7 g/dL (ref 31.5–35.7)
MCV: 88 fL (ref 79–97)
Platelets: 284 10*3/uL (ref 150–450)
RBC: 4.4 x10E6/uL (ref 3.77–5.28)
RDW: 12.7 % (ref 11.7–15.4)
WBC: 5.7 10*3/uL (ref 3.4–10.8)

## 2019-09-23 ENCOUNTER — Encounter: Payer: Self-pay | Admitting: *Deleted

## 2019-11-09 ENCOUNTER — Ambulatory Visit: Payer: BC Managed Care – PPO | Admitting: Nurse Practitioner

## 2019-11-10 ENCOUNTER — Other Ambulatory Visit: Payer: Self-pay

## 2019-11-10 ENCOUNTER — Ambulatory Visit
Admission: RE | Admit: 2019-11-10 | Discharge: 2019-11-10 | Disposition: A | Discharge status: Home / Self Care | Payer: BC Managed Care – PPO | Source: Ambulatory Visit | Attending: Nurse Practitioner | Admitting: Nurse Practitioner

## 2019-11-10 DIAGNOSIS — Z1231 Encounter for screening mammogram for malignant neoplasm of breast: Secondary | ICD-10-CM

## 2019-11-16 ENCOUNTER — Encounter: Payer: Self-pay | Admitting: Nurse Practitioner

## 2019-11-16 LAB — HM COLONOSCOPY

## 2019-11-17 ENCOUNTER — Encounter: Payer: Self-pay | Admitting: *Deleted

## 2020-02-29 ENCOUNTER — Telehealth: Payer: Self-pay

## 2020-02-29 DIAGNOSIS — B9689 Other specified bacterial agents as the cause of diseases classified elsewhere: Secondary | ICD-10-CM

## 2020-02-29 MED ORDER — METRONIDAZOLE 500 MG PO TABS: 500.0000 mg | ORAL_TABLET | Freq: Two times a day (BID) | ORAL | 0 refills | Status: AC

## 2020-02-29 NOTE — Telephone Encounter (Signed)
Pt called stating she is about to go out of town and she has vaginal discharge and odor and she is sure it is BV because she gets it often. Flagyl sent to pharmacy due to pt not being able to come to office for appt before she leaves to go out of town.

## 2020-04-13 ENCOUNTER — Telehealth: Payer: Self-pay

## 2020-04-14 NOTE — Telephone Encounter (Signed)
LVM FOR PT TO CALL [facility] FOR APPT THAT SHE REQUESTED

## 2020-09-12 ENCOUNTER — Encounter: Payer: BC Managed Care – PPO | Admitting: Nurse Practitioner

## 2022-01-08 ENCOUNTER — Other Ambulatory Visit: Payer: Self-pay | Admitting: Physician Assistant

## 2022-01-08 DIAGNOSIS — Z1231 Encounter for screening mammogram for malignant neoplasm of breast: Secondary | ICD-10-CM

## 2022-01-26 ENCOUNTER — Ambulatory Visit
Admission: RE | Admit: 2022-01-26 | Discharge: 2022-01-26 | Disposition: A | Discharge status: Home / Self Care | Payer: BC Managed Care – PPO | Source: Ambulatory Visit | Attending: Physician Assistant | Admitting: Physician Assistant

## 2022-01-26 ENCOUNTER — Ambulatory Visit: Payer: BC Managed Care – PPO

## 2022-01-26 DIAGNOSIS — Z1231 Encounter for screening mammogram for malignant neoplasm of breast: Secondary | ICD-10-CM

## 2022-07-23 IMAGING — MG MM DIGITAL SCREENING BILAT W/ TOMO AND CAD
7 series · 8 of 19 positions shown · non-contrast
Comparison: Previous exam(s).

CLINICAL DATA: Screening.

EXAM:
DIGITAL SCREENING BILATERAL MAMMOGRAM WITH TOMOSYNTHESIS AND CAD
TECHNIQUE: Bilateral screening digital craniocaudal and mediolateral oblique
mammograms were obtained. Bilateral screening digital breast
tomosynthesis was performed. The images were evaluated with
computer-aided detection.

[R MLO synth-2D]
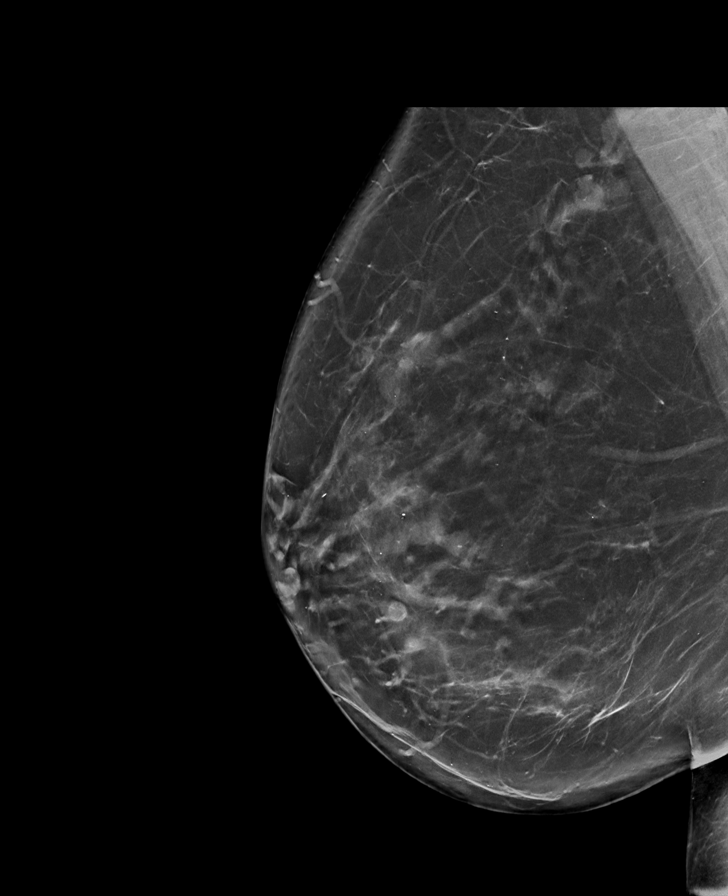

[R CC synth-2D]
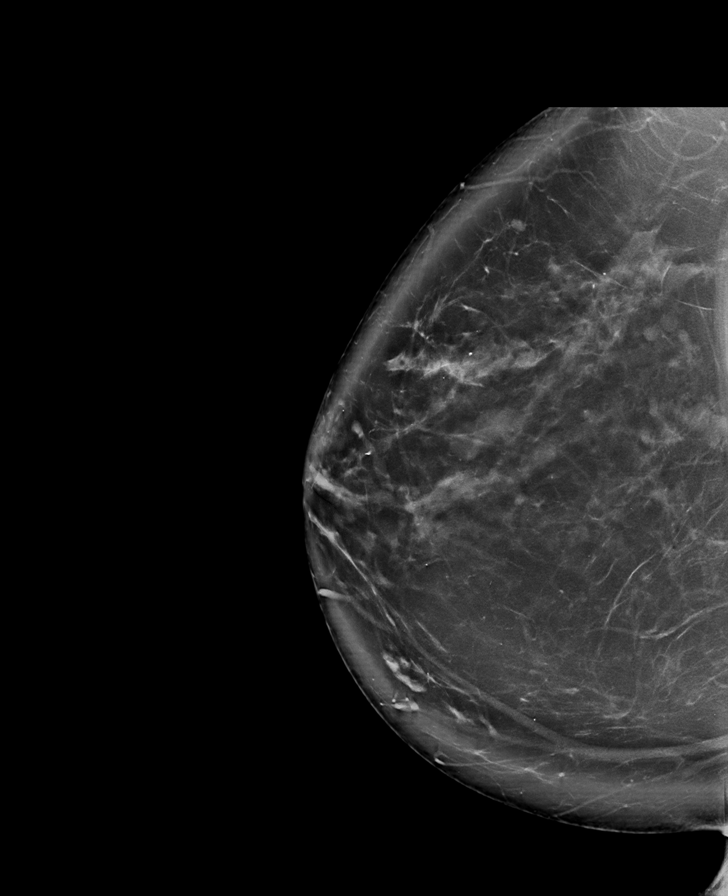

[L MLO synth-2D]
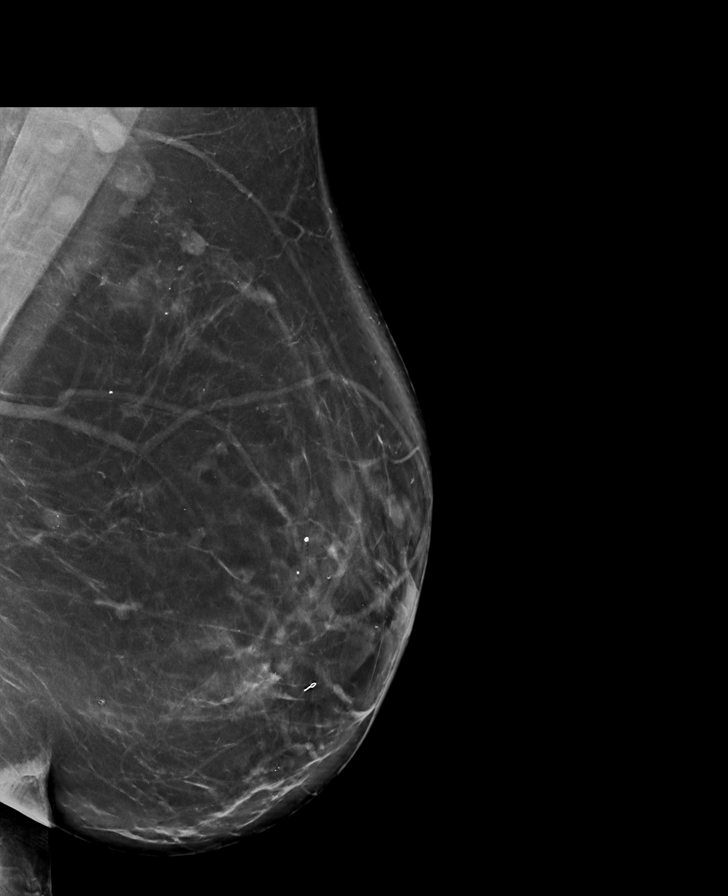

[L CC synth-2D]
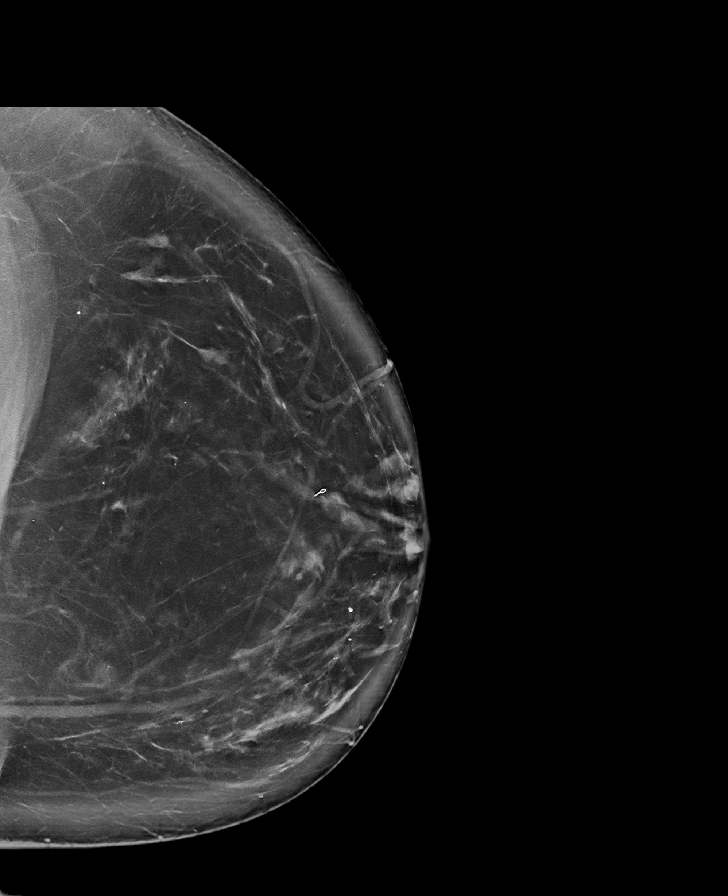

[R MLO tomo · 2 of 111 frames shown]
[frame 36/111]
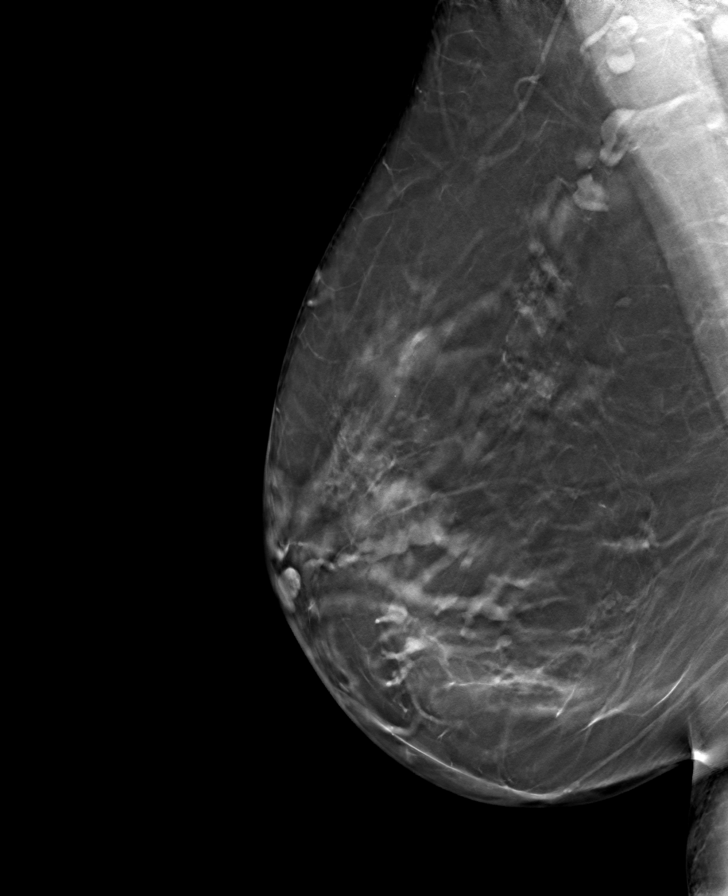
[frame 56/111]
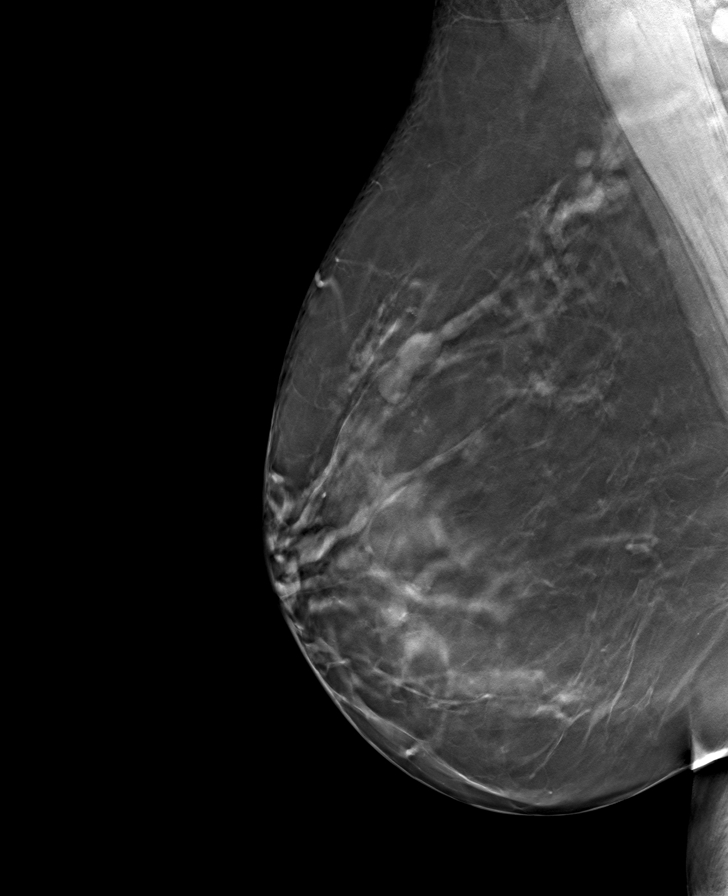

[L MLO tomo · tomo slice 57/112.0]
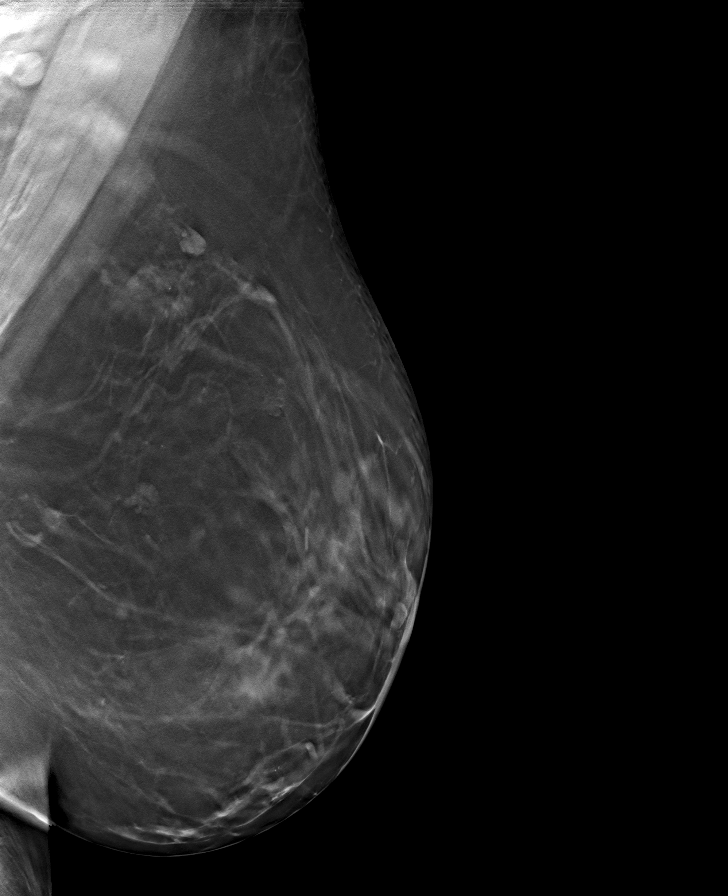

[L CC tomo · tomo slice 56/111.0]
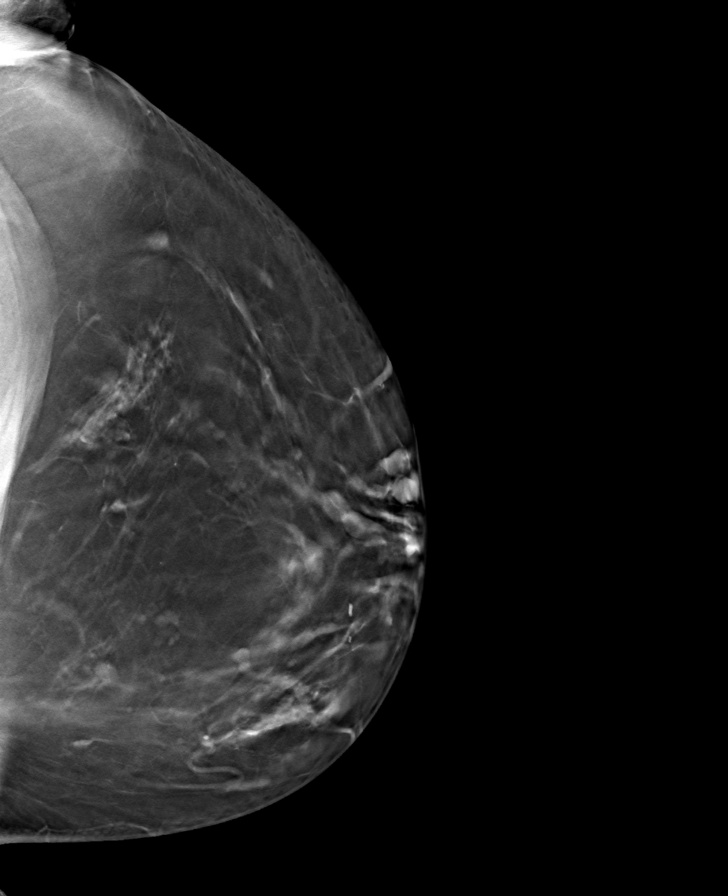

[8 of 19 positions shown; findings below may reference images not displayed]

ACR Breast Density Category b: There are scattered areas of
fibroglandular density.
FINDINGS: There are no findings suspicious for malignancy.
IMPRESSION: No mammographic evidence of malignancy. A result letter of this
screening mammogram will be mailed directly to the patient.

RECOMMENDATION:
Screening mammogram in one year. (Code:[id number]-O-LD2)

BI-RADS CATEGORY  1: Negative.

## 2024-02-11 ENCOUNTER — Emergency Department (HOSPITAL_BASED_OUTPATIENT_CLINIC_OR_DEPARTMENT_OTHER)
Admission: EM | Admit: 2024-02-11 | Discharge: 2024-02-11 | Disposition: A | Discharge status: Home / Self Care | Attending: Emergency Medicine | Admitting: Emergency Medicine

## 2024-02-11 ENCOUNTER — Encounter (HOSPITAL_BASED_OUTPATIENT_CLINIC_OR_DEPARTMENT_OTHER): Payer: Self-pay | Admitting: Urology

## 2024-02-11 ENCOUNTER — Emergency Department (HOSPITAL_BASED_OUTPATIENT_CLINIC_OR_DEPARTMENT_OTHER)

## 2024-02-11 ENCOUNTER — Other Ambulatory Visit: Payer: Self-pay

## 2024-02-11 DIAGNOSIS — I493 Ventricular premature depolarization: Secondary | ICD-10-CM | POA: Insufficient documentation

## 2024-02-11 DIAGNOSIS — Z79899 Other long term (current) drug therapy: Secondary | ICD-10-CM | POA: Insufficient documentation

## 2024-02-11 DIAGNOSIS — M25511 Pain in right shoulder: Secondary | ICD-10-CM | POA: Insufficient documentation

## 2024-02-11 LAB — COMPREHENSIVE METABOLIC PANEL
ALT: 18 U/L (ref 0–44)
AST: 20 U/L (ref 15–41)
Albumin: 4.3 g/dL (ref 3.5–5.0)
Alkaline Phosphatase: 68 U/L (ref 38–126)
Anion gap: 9 (ref 5–15)
BUN: 14 mg/dL (ref 6–20)
CO2: 25 mmol/L (ref 22–32)
Calcium: 9.1 mg/dL (ref 8.9–10.3)
Chloride: 103 mmol/L (ref 98–111)
Creatinine, Ser: 0.67 mg/dL (ref 0.44–1.00)
GFR, Estimated: >60 mL/min (ref >60)
Glucose, Bld: 85 mg/dL (ref 70–99)
Potassium: 3.5 mmol/L (ref 3.5–5.1)
Sodium: 137 mmol/L (ref 135–145)
Total Bilirubin: 0.7 mg/dL (ref 0.0–1.2)
Total Protein: 7.9 g/dL (ref 6.5–8.1)

## 2024-02-11 LAB — CBC WITH DIFFERENTIAL/PLATELET
Abs Immature Granulocytes: 0.02 10*3/uL (ref 0.00–0.07)
Basophils Absolute: 0 10*3/uL (ref 0.0–0.1)
Basophils Relative: 0 %
Eosinophils Absolute: 0.1 10*3/uL (ref 0.0–0.5)
Eosinophils Relative: 1 %
HCT: 41.3 % (ref 36.0–46.0)
Hemoglobin: 13.6 g/dL (ref 12.0–15.0)
Immature Granulocytes: 0 %
Lymphocytes Relative: 25 %
Lymphs Abs: 1.7 10*3/uL (ref 0.7–4.0)
MCH: 28.3 pg (ref 26.0–34.0)
MCHC: 32.9 g/dL (ref 30.0–36.0)
MCV: 85.9 fL (ref 80.0–100.0)
Monocytes Absolute: 0.4 10*3/uL (ref 0.1–1.0)
Monocytes Relative: 5 %
Neutro Abs: 4.8 10*3/uL (ref 1.7–7.7)
Neutrophils Relative %: 69 %
Platelets: 292 10*3/uL (ref 150–400)
RBC: 4.81 MIL/uL (ref 3.87–5.11)
RDW: 14.1 % (ref 11.5–15.5)
WBC: 7 10*3/uL (ref 4.0–10.5)
nRBC: 0 % (ref 0.0–0.2)

## 2024-02-11 LAB — TROPONIN I (HIGH SENSITIVITY): Troponin I (High Sensitivity): 8 ng/L (ref <18)

## 2024-02-11 LAB — MAGNESIUM: Magnesium: 2.1 mg/dL (ref 1.7–2.4)

## 2024-02-11 MED ORDER — NAPROXEN 375 MG PO TABS: 375.0000 mg | ORAL_TABLET | Freq: Two times a day (BID) | ORAL | 0 refills | Status: AC

## 2024-02-11 MED ORDER — AMLODIPINE BESYLATE 10 MG PO TABS: 10.0000 mg | ORAL_TABLET | Freq: Every day | ORAL | 0 refills | Status: AC

## 2024-02-11 NOTE — ED Provider Notes (Signed)
 [place] [facility] Provider Note   CSN: [id number] Arrival date & time: 02/11/24  1307     History  Chief Complaint  Patient presents with   Arm Pain   EKG changes    Mary Blankenship is a 58 y.o. female.  HPI Patient reports she has had some pain in her right shoulder for about 3 to 4 days.  She has a focal area over the anterior lateral deltoid where she has pain.  She denies there is any injury.  She cannot think of anything repetitive that she is doing at work.  She reports is not very reproducible.  It waxes and wanes somewhat in severity.  She has not tried anything for pain.  Patient denies any associated symptoms.  She has not had any chest pain, shortness of breath, nausea, lightheadedness, diaphoresis.  No fevers no cough.  No lower extremity swelling or calf pain.  After several days patient became concerned that was persisting and went to urgent care for evaluation.  Patient reports her blood pressures at home have been staying somewhat elevated as well.  She reports they are staying in the 130s over 90s.  She reports when she went to urgent care was higher and they also found that concerning.  Patient takes amlodipine 5 mg a day.  At urgent care patient was noted to have PVCs and in combination with shoulder pain sent for further cardiac evaluation.  Patient denies feeling any palpitations or missed beats.    Home Medications Prior to Admission medications   Medication Sig Start Date End Date Taking? Authorizing Provider  amLODipine (NORVASC) 10 MG tablet Take 1 tablet (10 mg total) by mouth daily. 02/11/24  Yes Arby Barrette, MD  naproxen (NAPROSYN) 375 MG tablet Take 1 tablet (375 mg total) by mouth 2 (two) times daily. 02/11/24  Yes Arby Barrette, MD  Cholecalciferol (VITAMIN D3) 5000 units CAPS Take 1 capsule by mouth once.    [provider]  hydrochlorothiazide (HYDRODIURIL) 12.5 MG tablet Take 1 tablet (12.5 mg total) by  mouth daily. 04/14/19   Arnette Felts, FNP  metroNIDAZOLE (FLAGYL) 500 MG tablet Take 1 tablet (500 mg total) by mouth 2 (two) times daily. 02/29/20   Allie Bossier, MD      Allergies    Patient has no known allergies.    Review of Systems   Review of Systems  Physical Exam Updated Vital Signs BP (!) 149/91   Pulse 76   Temp 98.4 F (36.9 C)   Resp 18   Ht 5\' 4"  (1.626 m)   Wt 102.8 kg   LMP 01/16/2012   SpO2 98%   BMI 38.90 kg/m  Physical Exam Constitutional:      Comments: Nontoxic alert well in appearance.  HENT:     Mouth/Throat:     Pharynx: Oropharynx is clear.  Eyes:     Extraocular Movements: Extraocular movements intact.  Cardiovascular:     Rate and Rhythm: Normal rate and regular rhythm.     Heart sounds: Normal heart sounds.  Pulmonary:     Effort: Pulmonary effort is normal.     Breath sounds: Normal breath sounds.  Abdominal:     General: There is no distension.     Palpations: Abdomen is soft.     Tenderness: There is no abdominal tenderness. There is no guarding.  Musculoskeletal:        General: No swelling, tenderness, deformity or signs of injury. Normal  range of motion.     Cervical back: Neck supple.     Right lower leg: No edema.     Left lower leg: No edema.     Comments: Right shoulder in appearance.  No palpable abnormalities or reproducible pain.  Range of motion intact.  Neurovascular exam normal.  Normal palpation over clavicle and anterior chest.  Lymphadenopathy:     Cervical: No cervical adenopathy.  Skin:    General: Skin is warm and dry.  Neurological:     General: No focal deficit present.     Mental Status: She is oriented to person, place, and time.     Motor: No weakness.     Coordination: Coordination normal.  Psychiatric:        Mood and Affect: Mood normal.     ED Results / Procedures / Treatments   Labs (all labs ordered are listed, but only abnormal results are displayed) Labs Reviewed  COMPREHENSIVE METABOLIC  PANEL  CBC WITH DIFFERENTIAL/PLATELET  MAGNESIUM  TROPONIN I (HIGH SENSITIVITY)  TROPONIN I (HIGH SENSITIVITY)    EKG EKG Interpretation Date/Time:  Tuesday February 11 2024 13:17:27 EDT Ventricular Rate:  79 PR Interval:  145 QRS Duration:  108 QT Interval:  413 QTC Calculation: 474 R Axis:   -15  Text Interpretation: Sinus rhythm Borderline left axis deviation Low voltage, precordial leads Abnormal R-wave progression, late transition Borderline T abnormalities, anterior leads agree, no acute ischemic changes Confirmed by Arby Barrette [phone]) on 02/11/2024 3:12:46 PM  Radiology No results found.  Procedures Procedures    Medications Ordered in ED Medications - No data to display  ED Course/ Medical Decision Making/ A&P                                 Medical Decision Making Amount and/or Complexity of Data Reviewed Labs: ordered. Radiology: ordered.  Risk Prescription drug management.   Patient presents as outlined.  By history shoulder pain sounds most likely be musculoskeletal.  It is limited to a very small focus of the shoulder and has no associated symptoms.  There is no paresthesia or numbness of the arm.  There is no radiation of the pain.  Patient's cardiac risk factors include hypertension and obesity.  No hypercholesterolemia.  Lipid panel tested 3\24.  At this time we will proceed with cardiac diagnostic evaluation, x-rays of the chest and shoulder.  Troponin normal.  Routine metabolic panel normal.  CBC normal.  Magnesium normal.  2 view chest x-ray reviewed by myself no cardiomegaly no focal consolidation no pneumothorax.  3 view shoulder x-ray reviewed by myself no dislocation or evident fracture.  No prominent bony lesions.  At this time patient has asymptomatic PVCs.  They are infrequent.  Electrolytes checked and troponin checked.  At this time these are normal.  Patient has heart score of 3.  With clinical history not suggestive at all of ACS and  score, I feel like patient is stable for completing diagnostic workup on outpatient basis.  We discussed follow-up with PCP for outpatient stress testing.  We reviewed need for immediate return for reassessment if any other additional symptoms.  We also discussed the fact that I have personally reviewed the x-rays and do not yet have radiology reviews.  Patient is comfortable with going home and following up on x-ray results in MyChart and with PCP.        Final Clinical Impression(s) / ED  Diagnoses Final diagnoses:  Acute pain of right shoulder  PVCs (premature ventricular contractions)    Rx / DC Orders ED Discharge Orders          Ordered    naproxen (NAPROSYN) 375 MG tablet  2 times daily        02/11/24 1707    amLODipine (NORVASC) 10 MG tablet  Daily        02/11/24 1707              Arby Barrette, MD 02/11/24 1723

## 2024-02-11 NOTE — Discharge Instructions (Addendum)
 1.  You said your blood pressures are staying in the higher 130s over 90s.  Increase your amlodipine dose to 10 mg daily. 2.  You were seen for shoulder pain.  At this time there are no associated symptoms.  This appears most likely to be musculoskeletal shoulder pain.  You may take naproxen twice a day as prescribed.  Try to pay attention to your activities and see if there is anything that you are doing repetitively or certain positions that might be causing shoulder problems. 3.  At this time it appears lower probability that you have heart disease but you do have some risk factors with hypertension.  Follow-up with your family doctor to discuss doing an outpatient cardiac stress test. 4.  Return to the emergency department immediately if you have chest pain, shortness of breath, feel weak dizzy nauseated or other concerning changes. 5.  I have personally reviewed your chest x-ray and your shoulder x-ray.  I do not see immediate emergent appearing problems.  It is very important that you follow-up on the radiology interpretation and review this with your doctor.  If there are any notations made that suggest additional studies or evaluations are needed, be sure to review this with your doctor.

## 2024-02-11 NOTE — ED Triage Notes (Signed)
 Pt states pain in upper right arm x 3 days since Sunday  Pinpoint pain in bicep area  Was seen at [facility] and told to come here due to EKG changes  NAD, denies any chest pain or SOB

## 2024-02-11 NOTE — ED Notes (Signed)
 Reviewed discharge instructions, follow up, and medications with pt. Pt states understanding and denies any pain.

## 2024-05-08 ENCOUNTER — Other Ambulatory Visit: Payer: Self-pay | Admitting: Physician Assistant

## 2024-05-08 DIAGNOSIS — Z1231 Encounter for screening mammogram for malignant neoplasm of breast: Secondary | ICD-10-CM
